# Patient Record
Sex: Female | Born: 1950 | Race: White | Hispanic: No | Marital: Married | Smoking: Never smoker
Health system: Southern US, Community
[De-identification: ages and names within clinical notes are randomized; demographics above are authoritative.]

## PROBLEM LIST (undated history)

## (undated) DIAGNOSIS — E079 Disorder of thyroid, unspecified: Secondary | ICD-10-CM

## (undated) DIAGNOSIS — F419 Anxiety disorder, unspecified: Secondary | ICD-10-CM

## (undated) DIAGNOSIS — M5020 Other cervical disc displacement, unspecified cervical region: Secondary | ICD-10-CM

## (undated) DIAGNOSIS — I10 Essential (primary) hypertension: Secondary | ICD-10-CM

## (undated) DIAGNOSIS — E785 Hyperlipidemia, unspecified: Secondary | ICD-10-CM

## (undated) DIAGNOSIS — E039 Hypothyroidism, unspecified: Secondary | ICD-10-CM

## (undated) HISTORY — PX: ANTERIOR CERVICAL DISCECTOMY: SHX1160

## (undated) HISTORY — PX: TONSILLECTOMY: SUR1361

## (undated) HISTORY — DX: Disorder of thyroid, unspecified: E07.9

## (undated) HISTORY — DX: Essential (primary) hypertension: I10

## (undated) HISTORY — DX: Anxiety disorder, unspecified: F41.9

## (undated) HISTORY — DX: Hyperlipidemia, unspecified: E78.5

## (undated) HISTORY — PX: ABDOMINAL HYSTERECTOMY: SHX81

## (undated) HISTORY — DX: Other cervical disc displacement, unspecified cervical region: M50.20

---

## 2015-12-06 DIAGNOSIS — E039 Hypothyroidism, unspecified: Secondary | ICD-10-CM | POA: Insufficient documentation

## 2015-12-06 DIAGNOSIS — I10 Essential (primary) hypertension: Secondary | ICD-10-CM | POA: Insufficient documentation

## 2015-12-06 DIAGNOSIS — Z981 Arthrodesis status: Secondary | ICD-10-CM | POA: Insufficient documentation

## 2015-12-06 DIAGNOSIS — E782 Mixed hyperlipidemia: Secondary | ICD-10-CM | POA: Insufficient documentation

## 2016-04-22 DIAGNOSIS — Z23 Encounter for immunization: Secondary | ICD-10-CM | POA: Diagnosis not present

## 2016-06-17 DIAGNOSIS — I10 Essential (primary) hypertension: Secondary | ICD-10-CM | POA: Diagnosis not present

## 2016-06-17 DIAGNOSIS — E782 Mixed hyperlipidemia: Secondary | ICD-10-CM | POA: Diagnosis not present

## 2016-06-17 DIAGNOSIS — M7711 Lateral epicondylitis, right elbow: Secondary | ICD-10-CM | POA: Diagnosis not present

## 2017-05-01 DIAGNOSIS — Z0000 Encounter for general adult medical examination without abnormal findings: Secondary | ICD-10-CM | POA: Diagnosis not present

## 2017-05-01 DIAGNOSIS — I10 Essential (primary) hypertension: Secondary | ICD-10-CM | POA: Diagnosis not present

## 2017-05-01 DIAGNOSIS — Z130 Encounter for screening for diseases of the blood and blood-forming organs and certain disorders involving the immune mechanism: Secondary | ICD-10-CM | POA: Diagnosis not present

## 2017-05-01 DIAGNOSIS — E039 Hypothyroidism, unspecified: Secondary | ICD-10-CM | POA: Diagnosis not present

## 2017-05-01 DIAGNOSIS — E785 Hyperlipidemia, unspecified: Secondary | ICD-10-CM | POA: Diagnosis not present

## 2017-05-06 DIAGNOSIS — I10 Essential (primary) hypertension: Secondary | ICD-10-CM | POA: Diagnosis not present

## 2017-11-23 DIAGNOSIS — H2513 Age-related nuclear cataract, bilateral: Secondary | ICD-10-CM | POA: Diagnosis not present

## 2017-12-14 ENCOUNTER — Ambulatory Visit (INDEPENDENT_AMBULATORY_CARE_PROVIDER_SITE_OTHER): Payer: Medicare Other | Admitting: Family Medicine

## 2017-12-14 ENCOUNTER — Encounter: Payer: Self-pay | Admitting: Family Medicine

## 2017-12-14 ENCOUNTER — Other Ambulatory Visit: Payer: Self-pay

## 2017-12-14 ENCOUNTER — Other Ambulatory Visit: Payer: Self-pay | Admitting: Family Medicine

## 2017-12-14 VITALS — BP 142/86 | HR 74 | Temp 98.1°F | Ht 64.5 in | Wt 143.2 lb

## 2017-12-14 DIAGNOSIS — Z23 Encounter for immunization: Secondary | ICD-10-CM | POA: Diagnosis not present

## 2017-12-14 DIAGNOSIS — M65341 Trigger finger, right ring finger: Secondary | ICD-10-CM | POA: Diagnosis not present

## 2017-12-14 DIAGNOSIS — E782 Mixed hyperlipidemia: Secondary | ICD-10-CM

## 2017-12-14 DIAGNOSIS — Z1239 Encounter for other screening for malignant neoplasm of breast: Secondary | ICD-10-CM

## 2017-12-14 DIAGNOSIS — I10 Essential (primary) hypertension: Secondary | ICD-10-CM

## 2017-12-14 DIAGNOSIS — Z1231 Encounter for screening mammogram for malignant neoplasm of breast: Secondary | ICD-10-CM | POA: Diagnosis not present

## 2017-12-14 DIAGNOSIS — E039 Hypothyroidism, unspecified: Secondary | ICD-10-CM

## 2017-12-14 LAB — TSH: TSH: 1.08 u[IU]/mL (ref 0.35–4.50)

## 2017-12-14 LAB — BASIC METABOLIC PANEL
BUN: 11 mg/dL (ref 6–23)
CHLORIDE: 100 meq/L (ref 96–112)
CO2: 27 mEq/L (ref 19–32)
Calcium: 9.9 mg/dL (ref 8.4–10.5)
Creatinine, Ser: 0.69 mg/dL (ref 0.40–1.20)
GFR: 90.27 mL/min (ref >60.00)
Glucose, Bld: 80 mg/dL (ref 70–99)
POTASSIUM: 3.8 meq/L (ref 3.5–5.1)
SODIUM: 136 meq/L (ref 135–145)

## 2017-12-14 MED ORDER — ZOSTER VAC RECOMB ADJUVANTED 50 MCG/0.5ML IM SUSR: 0.5000 mL | Freq: Once | INTRAMUSCULAR | 0 refills | Status: AC

## 2017-12-14 MED ORDER — LISINOPRIL 5 MG PO TABS: 5.0000 mg | ORAL_TABLET | Freq: Every day | ORAL | 3 refills | Status: DC

## 2017-12-14 NOTE — Progress Notes (Signed)
Subjective  CC:  Chief Complaint  Patient presents with  . Establish Care    Transfer from [facility], Last Physical 05/01/2017, Declines Dexa and Mammogram   . Immunizations    declines flu shot     HPI: Mercedes Briggs is a 67 y.o. female is a former [facility] patient and is here to reestablish care with me today. I last saw her in 2017. I have reviewed her records in detail  Last cpe 04/2017 with [facility]   She has the following concerns or needs:  Overall, she continues to do very well.  She has history of hypothyroidism and stopped her medications last year to see if she can control it naturally.  However her TSH was 17 in January.  She restarted her medications and feels well.  She does admit that she was feeling very cold all the time when she had stopped her medications.  She denies lower extremity edema, skin changes.  Her hair is still recovering.  No palpitations or sweats.  Hypertension: Was recommended to start lisinopril 10 mg in January but never did.  She keeps track of her blood pressures and has multiple logs averaging 150s over 60s to 70s on average.  She prefers not to use blood pressure medications.  Her diet is good, low in sodium, high in vegetables, and she remains very active.  She gets good rest.  She denies chest pain.  History of mixed hyperlipidemia but not on medications due to preferences.  Elevated cardiovascular risk.  See below.  She continues to decline medication.  Health maintenance: Has not had mammogram or bone density in many years.  Worries about radiation risk.  We discussed.  Due for Pneumovax.  Refuses flu shot.  Had Zostavax but due Shingrix.  I reviewed all her lab work.  New complaint: Trigger finger intermittently right hand and ring finger.  Tries to rest it.  No trauma.  Does lock  Assessment  1. Acquired hypothyroidism   2. Essential hypertension   3. Mixed hyperlipidemia   4. Trigger ring finger of right hand   5. Breast cancer screening       Plan   Hypothyroidism: Due for recheck on daily supplement.  Adjust dose if needed clinically euthyroid  Essential hypertension: Discussed diagnosis and treatment options.  Recommend lisinopril 5 mg daily.  Monitor for cough.  Check BMP today.  Recheck at follow-up visit to ensure stable electrolytes and renal function.  She should do well.  Patient willing to take medication daily.  Continue healthy lifestyle  Hyperlipidemia: Mildly elevated cardiovascular risk for adverse to statins.  Diet.  Finger: Recommend rest and splinting as needed.  Health maintenance: Pneumovax today.  Shingrix prescription printed for pharmacy.  Order mammogram.  Patient defers bone density at this time.  Recommended  Follow up:  Return in about 4 months (around 04/15/2018) for complete physical, follow up Hypertension, AWV.  Orders Placed This Encounter  Procedures  . MM DIGITAL SCREENING BILATERAL  . Pneumococcal polysaccharide vaccine 23-valent greater than or equal to 2yo subcutaneous/IM  . TSH  . Basic metabolic panel   Meds ordered this encounter  Medications  . lisinopril (PRINIVIL,ZESTRIL) 5 MG tablet    Sig: Take 1 tablet (5 mg total) by mouth daily.    Dispense:  90 tablet    Refill:  3  . Zoster Vaccine Adjuvanted [facility]) injection    Sig: Inject 0.5 mLs into the muscle once for 1 dose. Please give 2nd dose 2-6 months  after first dose    Dispense:  2 each    Refill:  0      We updated and reviewed the patient's past history in detail and it is documented below.  Patient Active Problem List   Diagnosis Date Noted  . Acquired hypothyroidism 12/06/2015  . Essential hypertension 12/06/2015  . History of fusion of cervical spine 12/06/2015  . Mixed hyperlipidemia 12/06/2015   Health Maintenance  Topic Date Due  . MAMMOGRAM  03/20/2001  . PNA vac Low Risk Adult (2 of 2 - PPSV23) 04/19/2017  . INFLUENZA VACCINE  12/14/2018 (Originally 11/05/2017)  . DEXA SCAN  12/14/2018 (Originally  03/20/2016)  . Hepatitis C Screening  12/15/2018 (Originally 02-23-1951)  . Fecal DNA (Cologuard)  01/02/2019   Immunization History  Administered Date(s) Administered  . Influenza, High Dose Seasonal PF 04/19/2016  . Pneumococcal Conjugate-13 04/19/2016  . Zoster 12/06/2015   Current Meds  Medication Sig  . Ascorbic Acid (VITAMIN C) 1000 MG tablet Take 1,000 mg by mouth daily.  Marland Kitchen b complex vitamins capsule Take 1 capsule by mouth daily.  . Coenzyme Q-10 100 MG capsule Take 100 mg by mouth daily.  Marland Kitchen levothyroxine (SYNTHROID, LEVOTHROID) 100 MCG tablet TAKE 1 TABLET BY MOUTH EVERY MORNING BEFORE BREAKFAST  . Magnesium 125 MG CAPS Take by mouth.  . Omega-3 Fatty Acids (OMEGA-3 2100 PO) Take by mouth.    Allergies: Patient is allergic to penicillins. Past Medical History Patient  has a past medical history of Herniated disc, cervical, Hyperlipidemia, Hypertension, and Thyroid disease. Past Surgical History Patient  has a past surgical history that includes Anterior cervical discectomy; Cesarean section; Tonsillectomy; and Abdominal hysterectomy. Family History: Patient family history includes Alzheimer's disease in her mother; Arthritis in her mother; Asthma in her father; Atrial fibrillation in her mother; COPD in her father; Dementia in her mother; Diabetes in her brother; Heart disease in her father; Hypertension in her father and mother; Neurodegenerative disease in her brother. Social History:  Patient  reports that she has quit smoking. She has never used smokeless tobacco. She reports that she drinks alcohol. She reports that she does not use drugs.  Review of Systems: Constitutional: negative for fever or malaise Ophthalmic: negative for photophobia, double vision or loss of vision Cardiovascular: negative for chest pain, dyspnea on exertion, or new LE swelling Respiratory: negative for SOB or persistent cough Gastrointestinal: negative for abdominal pain, change in bowel  habits or melena Genitourinary: negative for dysuria or gross hematuria Musculoskeletal: negative for new gait disturbance or muscular weakness Integumentary: negative for new or persistent rashes Neurological: negative for TIA or stroke symptoms Psychiatric: negative for SI or delusions Allergic/Immunologic: negative for hives  Patient Care Team    Relationship Specialty Notifications Start End  Willow Ora, MD PCP - General Family Medicine  12/14/17     Objective  Vitals: BP (!) 142/86   Pulse 74   Temp 98.1 F (36.7 C)   Ht 5' 4.5" (1.638 m)   Wt 143 lb 3.2 oz (65 kg)   SpO2 98%   BMI 24.20 kg/m  General:  Well developed, well nourished, no acute distress  Psych:  Alert and oriented,normal mood and affect HEENT:  Normocephalic, atraumatic, non-icteric sclera, PERRL, oropharynx is without mass or exudate, supple neck without adenopathy, mass or thyromegaly Cardiovascular:  RRR without gallop, rub or murmur, nondisplaced PMI Respiratory:  Good breath sounds bilaterally, CTAB with normal respiratory effort MSK: no deformities, contusions. Joints are without erythema or  swelling.  Skin:  Warm, no rashes or suspicious lesions noted Neurologic:    Mental status is normal. Gross motor and sensory exams are normal. Normal gait  Labs- see care everywhere: lipids. cmp and tsh.   Commons side effects, risks, benefits, and alternatives for medications and treatment plan prescribed today were discussed, and the patient expressed understanding of the given instructions. Patient is instructed to call or message via MyChart if he/she has any questions or concerns regarding our treatment plan. No barriers to understanding were identified. We discussed Red Flag symptoms and signs in detail. Patient expressed understanding regarding what to do in case of urgent or emergency type symptoms.   Medication list was reconciled, printed and provided to the patient in AVS. Patient instructions and  summary information was reviewed with the patient as documented in the AVS. This note was prepared with assistance of Dragon voice recognition software. Occasional wrong-word or sound-a-like substitutions may have occurred due to the inherent limitations of voice recognition software

## 2017-12-14 NOTE — Patient Instructions (Signed)
It was so good seeing you again! Thank you for establishing with my new practice and allowing me to continue caring for you. It means a lot to me.   Please schedule a follow up appointment with me in January 2020 for your annual complete physical; please come fasting.    Trigger Finger Trigger finger (stenosing tenosynovitis) is a condition that causes a finger to get stuck in a bent position. Each finger has a tough, cord-like tissue that connects muscle to bone (tendon), and each tendon is surrounded by a tunnel of tissue (tendon sheath). To move your finger, your tendon needs to slide freely through the sheath. Trigger finger happens when the tendon or the sheath thickens, making it difficult to move your finger. Trigger finger can affect any finger or a thumb. It may affect more than one finger. Mild cases may clear up with rest and medicine. Severe cases require more treatment. What are the causes? Trigger finger is caused by a thickened finger tendon or tendon sheath. The cause of this thickening is not known. What increases the risk? The following factors may make you more likely to develop this condition:  Doing activities that require a strong grip.  Having rheumatoid arthritis, gout, or diabetes.  Being 63-18 years old.  Being a woman.  What are the signs or symptoms? Symptoms of this condition include:  Pain when bending or straightening your finger.  Tenderness or swelling where your finger attaches to the palm of your hand.  A lump in the palm of your hand or on the inside of your finger.  Hearing a popping sound when you try to straighten your finger.  Feeling a popping, catching, or locking sensation when you try to straighten your finger.  Being unable to straighten your finger.  How is this diagnosed? This condition is diagnosed based on your symptoms and a physical exam. How is this treated? This condition may be treated by:  Resting your finger and  avoiding activities that make symptoms worse.  Wearing a finger splint to keep your finger in a slightly bent position.  Taking NSAIDs to relieve pain and swelling.  Injecting medicine (steroids) into the tendon sheath to reduce swelling and irritation. Injections may need to be repeated.  Having surgery to open the tendon sheath. This may be done if other treatments do not work and you cannot straighten your finger. You may need physical therapy after surgery.  Follow these instructions at home:  Use moist heat to help reduce pain and swelling as told by your health care provider.  Rest your finger and avoid activities that make pain worse. Return to normal activities as told by your health care provider.  If you have a splint, wear it as told by your health care provider.  Take over-the-counter and prescription medicines only as told by your health care provider.  Keep all follow-up visits as told by your health care provider. This is important. Contact a health care provider if:  Your symptoms are not improving with home care. Summary  Trigger finger (stenosing tenosynovitis) causes your finger to get stuck in a bent position, and it can make it difficult and painful to straighten your finger.  This condition develops when a finger tendon or tendon sheath thickens.  Treatment starts with resting, wearing a splint, and taking NSAIDs.  In severe cases, surgery to open the tendon sheath may be needed. This information is not intended to replace advice given to you by your health care  provider. Make sure you discuss any questions you have with your health care provider. Document Released: 01/12/2004 Document Revised: 03/04/2016 Document Reviewed: 03/04/2016 Elsevier Interactive Patient Education  2017 [employer]  Hypertension Hypertension, commonly called high blood pressure, is when the force of blood pumping through the arteries is too strong. The arteries are the blood  vessels that carry blood from the heart throughout the body. Hypertension forces the heart to work harder to pump blood and may cause arteries to become narrow or stiff. Having untreated or uncontrolled hypertension can cause heart attacks, strokes, kidney disease, and other problems. A blood pressure reading consists of a higher number over a lower number. Ideally, your blood pressure should be below 120/80. The first ("top") number is called the systolic pressure. It is a measure of the pressure in your arteries as your heart beats. The second ("bottom") number is called the diastolic pressure. It is a measure of the pressure in your arteries as the heart relaxes. What are the causes? The cause of this condition is not known. What increases the risk? Some risk factors for high blood pressure are under your control. Others are not. Factors you can change  Smoking.  Having type 2 diabetes mellitus, high cholesterol, or both.  Not getting enough exercise or physical activity.  Being overweight.  Having too much fat, sugar, calories, or salt (sodium) in your diet.  Drinking too much alcohol. Factors that are difficult or impossible to change  Having chronic kidney disease.  Having a family history of high blood pressure.  Age. Risk increases with age.  Race. You may be at higher risk if you are African-American.  Gender. Men are at higher risk than women before age 88. After age 29, women are at higher risk than men.  Having obstructive sleep apnea.  Stress. What are the signs or symptoms? Extremely high blood pressure (hypertensive crisis) may cause:  Headache.  Anxiety.  Shortness of breath.  Nosebleed.  Nausea and vomiting.  Severe chest pain.  Jerky movements you cannot control (seizures).  How is this diagnosed? This condition is diagnosed by measuring your blood pressure while you are seated, with your arm resting on a surface. The cuff of the blood pressure  monitor will be placed directly against the skin of your upper arm at the level of your heart. It should be measured at least twice using the same arm. Certain conditions can cause a difference in blood pressure between your right and left arms. Certain factors can cause blood pressure readings to be lower or higher than normal (elevated) for a short period of time:  When your blood pressure is higher when you are in a health care provider's office than when you are at home, this is called white coat hypertension. Most people with this condition do not need medicines.  When your blood pressure is higher at home than when you are in a health care provider's office, this is called masked hypertension. Most people with this condition may need medicines to control blood pressure.  If you have a high blood pressure reading during one visit or you have normal blood pressure with other risk factors:  You may be asked to return on a different day to have your blood pressure checked again.  You may be asked to monitor your blood pressure at home for 1 week or longer.  If you are diagnosed with hypertension, you may have other blood or imaging tests to help your health care provider  understand your overall risk for other conditions. How is this treated? This condition is treated by making healthy lifestyle changes, such as eating healthy foods, exercising more, and reducing your alcohol intake. Your health care provider may prescribe medicine if lifestyle changes are not enough to get your blood pressure under control, and if:  Your systolic blood pressure is above 130.  Your diastolic blood pressure is above 80.  Your personal target blood pressure may vary depending on your medical conditions, your age, and other factors. Follow these instructions at home: Eating and drinking  Eat a diet that is high in fiber and potassium, and low in sodium, added sugar, and fat. An example eating plan is called  the DASH (Dietary Approaches to Stop Hypertension) diet. To eat this way: ? Eat plenty of fresh fruits and vegetables. Try to fill half of your plate at each meal with fruits and vegetables. ? Eat whole grains, such as whole wheat pasta, brown rice, or whole grain bread. Fill about one quarter of your plate with whole grains. ? Eat or drink low-fat dairy products, such as skim milk or low-fat yogurt. ? Avoid fatty cuts of meat, processed or cured meats, and poultry with skin. Fill about one quarter of your plate with lean proteins, such as fish, chicken without skin, beans, eggs, and tofu. ? Avoid premade and processed foods. These tend to be higher in sodium, added sugar, and fat.  Reduce your daily sodium intake. Most people with hypertension should eat less than 1,500 mg of sodium a day.  Limit alcohol intake to no more than 1 drink a day for nonpregnant women and 2 drinks a day for men. One drink equals 12 oz of beer, 5 oz of wine, or 1 oz of hard liquor. Lifestyle  Work with your health care provider to maintain a healthy body weight or to lose weight. Ask what an ideal weight is for you.  Get at least 30 minutes of exercise that causes your heart to beat faster (aerobic exercise) most days of the week. Activities may include walking, swimming, or biking.  Include exercise to strengthen your muscles (resistance exercise), such as pilates or lifting weights, as part of your weekly exercise routine. Try to do these types of exercises for 30 minutes at least 3 days a week.  Do not use any products that contain nicotine or tobacco, such as cigarettes and e-cigarettes. If you need help quitting, ask your health care provider.  Monitor your blood pressure at home as told by your health care provider.  Keep all follow-up visits as told by your health care provider. This is important. Medicines  Take over-the-counter and prescription medicines only as told by your health care provider. Follow  directions carefully. Blood pressure medicines must be taken as prescribed.  Do not skip doses of blood pressure medicine. Doing this puts you at risk for problems and can make the medicine less effective.  Ask your health care provider about side effects or reactions to medicines that you should watch for. Contact a health care provider if:  You think you are having a reaction to a medicine you are taking.  You have headaches that keep coming back (recurring).  You feel dizzy.  You have swelling in your ankles.  You have trouble with your vision. Get help right away if:  You develop a severe headache or confusion.  You have unusual weakness or numbness.  You feel faint.  You have severe pain in your  chest or abdomen.  You vomit repeatedly.  You have trouble breathing. Summary  Hypertension is when the force of blood pumping through your arteries is too strong. If this condition is not controlled, it may put you at risk for serious complications.  Your personal target blood pressure may vary depending on your medical conditions, your age, and other factors. For most people, a normal blood pressure is less than 120/80.  Hypertension is treated with lifestyle changes, medicines, or a combination of both. Lifestyle changes include weight loss, eating a healthy, low-sodium diet, exercising more, and limiting alcohol. This information is not intended to replace advice given to you by your health care provider. Make sure you discuss any questions you have with your health care provider. Document Released: 03/24/2005 Document Revised: 02/20/2016 Document Reviewed: 02/20/2016 Elsevier Interactive Patient Education  [employer].

## 2017-12-15 ENCOUNTER — Other Ambulatory Visit: Payer: Self-pay | Admitting: Emergency Medicine

## 2017-12-15 MED ORDER — LEVOTHYROXINE SODIUM 100 MCG PO TABS: ORAL_TABLET | ORAL | 3 refills | Status: DC

## 2017-12-15 NOTE — Progress Notes (Signed)
Please call patient: I have reviewed his/her lab results. Thyroid is now perfect. Continue taking thyroid medication daily. Kidney function is normal as well. Ok to start low dose lisinopril for blood pressure. Thanks!

## 2017-12-24 ENCOUNTER — Telehealth: Payer: Self-pay | Admitting: Family Medicine

## 2017-12-24 MED ORDER — LEVOTHYROXINE SODIUM 100 MCG PO TABS: ORAL_TABLET | ORAL | 3 refills | Status: DC

## 2017-12-24 NOTE — Telephone Encounter (Signed)
Prescription sent to [facility], this morning.   Kathi SimpersAmy Peterman,  LPN

## 2017-12-24 NOTE — Telephone Encounter (Signed)
Copied from CRM [id number]. Topic: Quick Communication - See Telephone Encounter >> Dec 24, 2017  9:08 AM Arlyss Gandyichardson, Keigan Tafoya N, NT wrote: CRM for notification. See Telephone encounter for: 12/24/17. Pt states that [facility] did not receive the refill request for levothyroxine (SYNTHROID, LEVOTHROID) 100 MCG tablet and they stated to the pt that it will need to be faxed. Also, [place]. I did make this change under her preferred pharmacies. Please fax to [phone]. Rx#: [id number]

## 2018-01-14 ENCOUNTER — Ambulatory Visit
Admission: RE | Admit: 2018-01-14 | Discharge: 2018-01-14 | Disposition: A | Discharge status: Home / Self Care | Payer: Medicare Other | Source: Ambulatory Visit | Attending: Family Medicine | Admitting: Family Medicine

## 2018-01-14 DIAGNOSIS — Z1231 Encounter for screening mammogram for malignant neoplasm of breast: Secondary | ICD-10-CM | POA: Diagnosis not present

## 2018-12-13 DIAGNOSIS — H2513 Age-related nuclear cataract, bilateral: Secondary | ICD-10-CM | POA: Diagnosis not present

## 2018-12-20 ENCOUNTER — Encounter: Payer: Self-pay | Admitting: Family Medicine

## 2018-12-20 ENCOUNTER — Ambulatory Visit (INDEPENDENT_AMBULATORY_CARE_PROVIDER_SITE_OTHER): Payer: Medicare Other | Admitting: Family Medicine

## 2018-12-20 VITALS — BP 127/65 | Wt 137.8 lb

## 2018-12-20 DIAGNOSIS — E782 Mixed hyperlipidemia: Secondary | ICD-10-CM

## 2018-12-20 DIAGNOSIS — E039 Hypothyroidism, unspecified: Secondary | ICD-10-CM

## 2018-12-20 DIAGNOSIS — I10 Essential (primary) hypertension: Secondary | ICD-10-CM | POA: Diagnosis not present

## 2018-12-20 MED ORDER — LEVOTHYROXINE SODIUM 100 MCG PO TABS: ORAL_TABLET | ORAL | 3 refills | Status: DC

## 2018-12-20 MED ORDER — LISINOPRIL 5 MG PO TABS: 5.0000 mg | ORAL_TABLET | Freq: Every day | ORAL | 3 refills | Status: DC

## 2018-12-20 NOTE — Progress Notes (Signed)
Virtual Visit via Video Note  Subjective  CC:  Chief Complaint  Patient presents with  . Hypothyroidism  . Hypertension    Checks at home twice weekly, average 132/64.Marland Kitchen Reports highest has 141/57.  Marland Kitchen Hyperlipidemia     I connected with Mercedes Briggs on 12/20/18 at 10:40 AM EDT by a video enabled telemedicine application and verified that I am speaking with the correct person using two identifiers. Location patient: Home Location provider: [facility] participating in the virtual visit: Mercedes Briggs, Mercedes Arnt, MD Mercedes Briggs, Mercedes Briggs discussed the limitations of evaluation and management by telemedicine and the availability of in person appointments. The patient expressed understanding and agreed to proceed. HPI: Mercedes Briggs is a 68 y.o. female who was contacted today to address the problems listed above in the chief complaint/HTN f/u:  . Hypertension f/u: Control is good . Pt reports she is doing well. taking medications as instructed, no medication side effects noted, no TIAs, no chest pain on exertion, no dyspnea on exertion, no swelling of ankles. Due for visit and blood work. On low dose lisinopril and says she feels better. She denies adverse effects from his BP medications. Compliance with medication is good. Walking daily for exercise.  . Low thyroid on meds w/o sxs of low or high thyroid.  Needs refill.  She is overdue for blood work. . History of borderline hyperlipidemia: Due for recheck.  Eats healthy diet.  At ideal body weight. . Health maintenance: Due for complete physical.  Due for bone density and mammogram etc.  Discussed flu shot today.  BP Readings from Last 3 Encounters:  12/20/18 127/65  12/14/17 (!) 142/86   Wt Readings from Last 3 Encounters:  12/20/18 137 lb 12.8 oz (62.5 kg)  12/14/17 143 lb 3.2 oz (65 kg)    No results found for: CHOL No results found for: HDL No results  found for: LDLCALC No results found for: TRIG No results found for: CHOLHDL No results found for: LDLDIRECT Lab Results  Component Value Date   CREATININE 0.69 12/14/2017   BUN 11 12/14/2017   NA 136 12/14/2017   K 3.8 12/14/2017   CL 100 12/14/2017   CO2 27 12/14/2017    The ASCVD Risk score (Goff DC Jr., et al., 2013) failed to calculate for the following reasons:   Cannot find a previous HDL lab  Assessment  1. Essential hypertension   2. Acquired hypothyroidism   3. Mixed hyperlipidemia      Plan   Hypertension f/u: Good control by home readings.  Continue lisinopril.  Recommend visit to recheck blood work patient to schedule  Hypothyroidism: Well-controlled clinically.  Recommend visit to check blood work.  Refill medications.  History of hyperlipidemia and due for complete physical.  I discussed the assessment and treatment plan with the patient. The patient was provided an opportunity to ask questions and all were answered. The patient agreed with the plan and demonstrated an understanding of the instructions.   The patient was advised to call back or seek an in-person evaluation if the symptoms worsen or if the condition fails to improve as anticipated. Follow up: Complete physical with lab work.  Come fasting Visit date not found  Meds ordered this encounter  Medications  . levothyroxine (SYNTHROID) 100 MCG tablet    Sig: TAKE 1 TABLET BY MOUTH EVERY MORNING BEFORE BREAKFAST    Dispense:  90 tablet  Refill:  3  . lisinopril (ZESTRIL) 5 MG tablet    Sig: Take 1 tablet (5 mg total) by mouth daily.    Dispense:  90 tablet    Refill:  3      I reviewed the patients updated PMH, FH, and SocHx.    Patient Active Problem List   Diagnosis Date Noted  . Acquired hypothyroidism 12/06/2015  . Essential hypertension 12/06/2015  . History of fusion of cervical spine 12/06/2015  . Mixed hyperlipidemia 12/06/2015   Current Meds  Medication Sig  . Ascorbic Acid  (VITAMIN C) 1000 MG tablet Take 1,000 mg by mouth daily.  Marland Kitchen. b complex vitamins capsule Take 1 capsule by mouth daily.  . Coenzyme Q-10 100 MG capsule Take 100 mg by mouth daily.  Marland Kitchen. levothyroxine (SYNTHROID) 100 MCG tablet TAKE 1 TABLET BY MOUTH EVERY MORNING BEFORE BREAKFAST  . lisinopril (ZESTRIL) 5 MG tablet Take 1 tablet (5 mg total) by mouth daily.  . Magnesium 125 MG CAPS Take by mouth.  . Omega-3 Fatty Acids (OMEGA-3 2100 PO) Take by mouth.  . [DISCONTINUED] levothyroxine (SYNTHROID, LEVOTHROID) 100 MCG tablet TAKE 1 TABLET BY MOUTH EVERY MORNING BEFORE BREAKFAST  . [DISCONTINUED] lisinopril (PRINIVIL,ZESTRIL) 5 MG tablet Take 1 tablet (5 mg total) by mouth daily.    Allergies: Patient is allergic to penicillins. Family History: Patient family history includes Alzheimer's disease in her mother; Arthritis in her mother; Asthma in her father; Atrial fibrillation in her mother; COPD in her father; Dementia in her mother; Diabetes in her brother; Heart disease in her father; Hypertension in her father and mother; Neurodegenerative disease in her brother. Social History:  Patient  reports that she has quit smoking. She has never used smokeless tobacco. She reports current alcohol use. She reports that she does not use drugs.  Review of Systems: Constitutional: Negative for fever malaise or anorexia Cardiovascular: negative for chest pain Respiratory: negative for SOB or persistent cough Gastrointestinal: negative for abdominal pain  OBJECTIVE Vitals: BP 127/65   Wt 137 lb 12.8 oz (62.5 kg)   BMI 23.29 kg/m  General: no acute distress , A&Ox3  Mercedes Oraamille L Jaleil Renwick, MD

## 2019-01-10 DIAGNOSIS — Z23 Encounter for immunization: Secondary | ICD-10-CM | POA: Diagnosis not present

## 2019-01-13 ENCOUNTER — Ambulatory Visit: Payer: Medicare Other

## 2019-04-06 ENCOUNTER — Encounter: Payer: Medicare Other | Admitting: Family Medicine

## 2019-04-14 ENCOUNTER — Ambulatory Visit: Payer: Medicare Other | Attending: Internal Medicine

## 2019-04-14 DIAGNOSIS — Z20822 Contact with and (suspected) exposure to covid-19: Secondary | ICD-10-CM

## 2019-04-16 LAB — NOVEL CORONAVIRUS, NAA: SARS-CoV-2, NAA: NOT DETECTED

## 2019-05-20 DIAGNOSIS — Z23 Encounter for immunization: Secondary | ICD-10-CM | POA: Diagnosis not present

## 2019-06-17 DIAGNOSIS — Z23 Encounter for immunization: Secondary | ICD-10-CM | POA: Diagnosis not present

## 2019-07-21 ENCOUNTER — Other Ambulatory Visit: Payer: Self-pay

## 2019-07-21 ENCOUNTER — Ambulatory Visit (INDEPENDENT_AMBULATORY_CARE_PROVIDER_SITE_OTHER): Payer: Medicare Other

## 2019-07-21 ENCOUNTER — Telehealth: Payer: Self-pay

## 2019-07-21 DIAGNOSIS — Z0000 Encounter for general adult medical examination without abnormal findings: Secondary | ICD-10-CM

## 2019-07-21 DIAGNOSIS — Z1211 Encounter for screening for malignant neoplasm of colon: Secondary | ICD-10-CM

## 2019-07-21 NOTE — Progress Notes (Signed)
This visit is being conducted via phone call due to the COVID-19 pandemic. This patient has given me verbal consent via phone to conduct this visit, patient states they are participating from their home address. Some vital signs may be absent or patient reported.   Patient identification: identified by name, DOB, and current address.  Location provider: [facility], Office Persons participating in the virtual visit: Kandis Fantasia LPN, patient, and Dr. Asencion Partridge     Subjective:   Mercedes Briggs is a 69 y.o. female who presents for Medicare Annual (Subsequent) preventive examination.  Review of Systems:   Cardiac Risk Factors include: advanced age (>51men, >37 women);hypertension    Objective:     Vitals: There were no vitals taken for this visit.  There is no height or weight on file to calculate BMI.  Advanced Directives 07/21/2019  Does Patient Have a Medical Advance Directive? Yes  Type of Advance Directive Living will;Healthcare Power of Attorney  Does patient want to make changes to medical advance directive? No - Patient declined  Copy of Healthcare Power of Attorney in Chart? No - copy requested    Tobacco Social History   Tobacco Use  Smoking Status Former Smoker  Smokeless Tobacco Never Used  Tobacco Comment   occasionally      Counseling given: Not Answered Comment: occasionally    Clinical Intake:  Pre-visit preparation completed: Yes  Pain : No/denies pain  Diabetes: No  How often do you need to have someone help you when you read instructions, pamphlets, or other written materials from your doctor or pharmacy?: 1 - Never  Interpreter Needed?: No  Information entered by :: Kandis Fantasia LPN  Past Medical History:  Diagnosis Date  . Herniated disc, cervical   . Hyperlipidemia   . Hypertension   . Thyroid disease    Past Surgical History:  Procedure Laterality Date  . ABDOMINAL HYSTERECTOMY    . ANTERIOR CERVICAL DISCECTOMY    .  CESAREAN SECTION    . TONSILLECTOMY     Family History  Problem Relation Age of Onset  . Diabetes Brother   . Neurodegenerative disease Brother        storage cell disease  . Alzheimer's disease Mother   . Arthritis Mother   . Atrial fibrillation Mother   . Dementia Mother   . Hypertension Mother   . Asthma Father   . COPD Father   . Heart disease Father   . Hypertension Father    Social History   Socioeconomic History  . Marital status: Married    Spouse name: Not on file  . Number of children: Not on file  . Years of education: Not on file  . Highest education level: Not on file  Occupational History  . Occupation: Retired     Comment: Doctor, general practice   Tobacco Use  . Smoking status: Former Games developer  . Smokeless tobacco: Never Used  . Tobacco comment: occasionally   Substance and Sexual Activity  . Alcohol use: Yes    Comment: socially  . Drug use: Never  . Sexual activity: Yes    Birth control/protection: Post-menopausal  Other Topics Concern  . Not on file  Social History Narrative   1 daughter that lives in [place]    Social Determinants of Health   Financial Resource Strain:   . Difficulty of Paying Living Expenses:   Food Insecurity:   . Worried About Programme researcher, broadcasting/film/video in the Last Year:   . Ran  Out of Food in the Last Year:   Transportation Needs:   . Lack of Transportation (Medical):   Marland Kitchen Lack of Transportation (Non-Medical):   Physical Activity:   . Days of Exercise per Week:   . Minutes of Exercise per Session:   Stress:   . Feeling of Stress :   Social Connections:   . Frequency of Communication with Friends and Family:   . Frequency of Social Gatherings with Friends and Family:   . Attends Religious Services:   . Active Member of Clubs or Organizations:   . Attends Banker Meetings:   Marland Kitchen Marital Status:     Outpatient Encounter Medications as of 07/21/2019  Medication Sig  . Ascorbic Acid (VITAMIN C) 1000 MG tablet Take  1,000 mg by mouth daily.  Marland Kitchen b complex vitamins capsule Take 1 capsule by mouth daily.  . Coenzyme Q-10 100 MG capsule Take 100 mg by mouth daily.  Marland Kitchen levothyroxine (SYNTHROID) 100 MCG tablet TAKE 1 TABLET BY MOUTH EVERY MORNING BEFORE BREAKFAST  . lisinopril (ZESTRIL) 5 MG tablet Take 1 tablet (5 mg total) by mouth daily.  . Magnesium 125 MG CAPS Take by mouth.  . Omega-3 Fatty Acids (OMEGA-3 2100 PO) Take by mouth.   No facility-administered encounter medications on file as of 07/21/2019.    Activities of Daily Living In your present state of health, do you have any difficulty performing the following activities: 07/21/2019  Hearing? N  Vision? N  Difficulty concentrating or making decisions? N  Walking or climbing stairs? N  Dressing or bathing? N  Doing errands, shopping? N  Preparing Food and eating ? N  Using the Toilet? N  In the past six months, have you accidently leaked urine? N  Do you have problems with loss of bowel control? N  Managing your Medications? N  Managing your Finances? N  Housekeeping or managing your Housekeeping? N  Some recent data might be hidden    Patient Care Team: Willow Ora, MD as PCP - General (Family Medicine) Annette Stable, OD as Consulting Physician (Optometry)    Assessment:   This is a routine wellness examination for Mercedes Briggs.  Exercise Activities and Dietary recommendations Current Exercise Habits: Home exercise routine, Type of exercise: walking, Time (Minutes): 45, Frequency (Times/Week): 5, Weekly Exercise (Minutes/Week): 225  Goals   None     Fall Risk Fall Risk  07/21/2019 12/20/2018 12/14/2017  Falls in the past year? 0 0 No  Number falls in past yr: 0 0 -  Injury with Fall? 0 0 -  Follow up Falls evaluation completed;Education provided;Falls prevention discussed Falls evaluation completed -   Is the patient's home free of loose throw rugs in walkways, pet beds, electrical cords, etc?   yes      Grab bars in the  bathroom? yes      Handrails on the stairs?   yes      Adequate lighting?   yes  Depression Screen PHQ 2/9 Scores 07/21/2019 12/20/2018 12/14/2017  PHQ - 2 Score 0 0 0     Cognitive Function: no cognitive concerns at this time    6CIT Screen 07/21/2019  What Year? 0 points  What month? 0 points  What time? 0 points  Count back from 20 0 points  Months in reverse 0 points  Repeat phrase 0 points  Total Score 0    Immunization History  Administered Date(s) Administered  . Influenza, High Dose Seasonal PF 04/19/2016  .  Pneumococcal Conjugate-13 04/19/2016  . Pneumococcal Polysaccharide-23 12/14/2017  . Zoster 12/06/2015    Qualifies for Shingles Vaccine?Discussed and patient will check with pharmacy for coverage.  Patient education handout provided   Screening Tests Health Maintenance  Topic Date Due  . Hepatitis C Screening  Never done  . DEXA SCAN  Never done  . Fecal DNA (Cologuard)  01/02/2019  . MAMMOGRAM  01/15/2019  . INFLUENZA VACCINE  11/06/2019  . PNA vac Low Risk Adult  Completed    Cancer Screenings: Lung: Low Dose CT Chest recommended if Age 52-80 years, 30 pack-year currently smoking OR have quit w/in 15years. Patient does not qualify. Breast:  Up to date on Mammogram? Yes   Up to date of Bone Density/Dexa? No; declines at this time  Colorectal: Cologuard ordered today    Plan:  I have personally reviewed and addressed the Medicare Annual Wellness questionnaire and have noted the following in the patient's chart:  A. Medical and social history B. Use of alcohol, tobacco or illicit drugs  C. Current medications and supplements D. Functional ability and status E.  Nutritional status F.  Physical activity G. Advance directives H. List of other physicians I.  Hospitalizations, surgeries, and ER visits in previous 12 months J.  [place] such as hearing and vision if needed, cognitive and depression L. Referrals, records requested, and  appointments- Cologuard ordered   In addition, I have reviewed and discussed with patient certain preventive protocols, quality metrics, and best practice recommendations. A written personalized care plan for preventive services as well as general preventive health recommendations were provided to patient.   Signed,  Denman George, LPN  Nurse Health Advisor   Nurse Notes: Patient would like to have labs before 09/26/19 visit to be able to discuss results during office visit.  See telephone note.  FYI patient has completed Covid vaccines- Moderna

## 2019-07-21 NOTE — Patient Instructions (Signed)
Ms. Due , Thank you for taking time to come for your Medicare Wellness Visit. I appreciate your ongoing commitment to your health goals. Please review the following plan we discussed and let me know if I can assist you in the future.   Screening recommendations/referrals: Colorectal Screening: Cologuard ordered today Mammogram: up to date; last 01/14/18 (recommended repeat 01/2020) Bone Density: recommended   Vision and Dental Exams: Recommended annual ophthalmology exams for early detection of glaucoma and other disorders of the eye Recommended annual dental exams for proper oral hygiene  Vaccinations: Influenza vaccine: completed 01/10/19 Pneumococcal vaccine: up to date; last 12/14/17 Tdap vaccine: recommended every 10 years; Please call your insurance company to determine your out of pocket expense. You also receive this vaccine at your local pharmacy or Health Dept. Shingles vaccine:  You may receive this vaccine at your local pharmacy. (see handout)  Covid vaccine:  Completed   Advanced directives: Please bring a copy of your POA (Power of Attorney) and/or Living Will to your next appointment.  Goals: Recommend to drink at least 6-8 8oz glasses of water per day and consume a balanced diet rich in fresh fruits and vegetables.   Next appointment: Please schedule your Annual Wellness Visit with your Nurse Health Advisor in one year.  Preventive Care 51 Years and Older, Female Preventive care refers to lifestyle choices and visits with your health care provider that can promote health and wellness. What does preventive care include?  A yearly physical exam. This is also called an annual well check.  Dental exams once or twice a year.  Routine eye exams. Ask your health care provider how often you should have your eyes checked.  Personal lifestyle choices, including:  Daily care of your teeth and gums.  Regular physical activity.  Eating a healthy diet.   Avoiding tobacco and drug use.  Limiting alcohol use.  Practicing safe sex.  Taking low-dose aspirin every day if recommended by your health care provider.  Taking vitamin and mineral supplements as recommended by your health care provider. What happens during an annual well check? The services and screenings done by your health care provider during your annual well check will depend on your age, overall health, lifestyle risk factors, and family history of disease. Counseling  Your health care provider may ask you questions about your:  Alcohol use.  Tobacco use.  Drug use.  Emotional well-being.  Home and relationship well-being.  Sexual activity.  Eating habits.  History of falls.  Memory and ability to understand (cognition).  Work and work Statistician.  Reproductive health. Screening  You may have the following tests or measurements:  Height, weight, and BMI.  Blood pressure.  Lipid and cholesterol levels. These may be checked every 5 years, or more frequently if you are over 42 years old.  Skin check.  Lung cancer screening. You may have this screening every year starting at age 33 if you have a 30-pack-year history of smoking and currently smoke or have quit within the past 15 years.  Fecal occult blood test (FOBT) of the stool. You may have this test every year starting at age 90.  Flexible sigmoidoscopy or colonoscopy. You may have a sigmoidoscopy every 5 years or a colonoscopy every 10 years starting at age 73.  Hepatitis C blood test.  Hepatitis B blood test.  Sexually transmitted disease (STD) testing.  Diabetes screening. This is done by checking your blood sugar (glucose) after you have not eaten for a while (fasting).  You may have this done every 1-3 years.  Bone density scan. This is done to screen for osteoporosis. You may have this done starting at age 21.  Mammogram. This may be done every 1-2 years. Talk to your health care provider  about how often you should have regular mammograms. Talk with your health care provider about your test results, treatment options, and if necessary, the need for more tests. Vaccines  Your health care provider may recommend certain vaccines, such as:  Influenza vaccine. This is recommended every year.  Tetanus, diphtheria, and acellular pertussis (Tdap, Td) vaccine. You may need a Td booster every 10 years.  Zoster vaccine. You may need this after age 35.  Pneumococcal 13-valent conjugate (PCV13) vaccine. One dose is recommended after age 43.  Pneumococcal polysaccharide (PPSV23) vaccine. One dose is recommended after age 33. Talk to your health care provider about which screenings and vaccines you need and how often you need them. This information is not intended to replace advice given to you by your health care provider. Make sure you discuss any questions you have with your health care provider. Document Released: 04/20/2015 Document Revised: 12/12/2015 Document Reviewed: 01/23/2015 Elsevier Interactive Patient Education  2017 [employer].  Fall Prevention in the Home Falls can cause injuries. They can happen to people of all ages. There are many things you can do to make your home safe and to help prevent falls. What can I do on the outside of my home?  Regularly fix the edges of walkways and driveways and fix any cracks.  Remove anything that might make you trip as you walk through a door, such as a raised step or threshold.  Trim any bushes or trees on the path to your home.  Use bright outdoor lighting.  Clear any walking paths of anything that might make someone trip, such as rocks or tools.  Regularly check to see if handrails are loose or broken. Make sure that both sides of any steps have handrails.  Any raised decks and porches should have guardrails on the edges.  Have any leaves, snow, or ice cleared regularly.  Use sand or salt on walking paths during winter.   Clean up any spills in your garage right away. This includes oil or grease spills. What can I do in the bathroom?  Use night lights.  Install grab bars by the toilet and in the tub and shower. Do not use towel bars as grab bars.  Use non-skid mats or decals in the tub or shower.  If you need to sit down in the shower, use a plastic, non-slip stool.  Keep the floor dry. Clean up any water that spills on the floor as soon as it happens.  Remove soap buildup in the tub or shower regularly.  Attach bath mats securely with double-sided non-slip rug tape.  Do not have throw rugs and other things on the floor that can make you trip. What can I do in the bedroom?  Use night lights.  Make sure that you have a light by your bed that is easy to reach.  Do not use any sheets or blankets that are too big for your bed. They should not hang down onto the floor.  Have a firm chair that has side arms. You can use this for support while you get dressed.  Do not have throw rugs and other things on the floor that can make you trip. What can I do in the kitchen?  Clean up any spills right away.  Avoid walking on wet floors.  Keep items that you use a lot in easy-to-reach places.  If you need to reach something above you, use a strong step stool that has a grab bar.  Keep electrical cords out of the way.  Do not use floor polish or wax that makes floors slippery. If you must use wax, use non-skid floor wax.  Do not have throw rugs and other things on the floor that can make you trip. What can I do with my stairs?  Do not leave any items on the stairs.  Make sure that there are handrails on both sides of the stairs and use them. Fix handrails that are broken or loose. Make sure that handrails are as long as the stairways.  Check any carpeting to make sure that it is firmly attached to the stairs. Fix any carpet that is loose or worn.  Avoid having throw rugs at the top or bottom of  the stairs. If you do have throw rugs, attach them to the floor with carpet tape.  Make sure that you have a light switch at the top of the stairs and the bottom of the stairs. If you do not have them, ask someone to add them for you. What else can I do to help prevent falls?  Wear shoes that:  Do not have high heels.  Have rubber bottoms.  Are comfortable and fit you well.  Are closed at the toe. Do not wear sandals.  If you use a stepladder:  Make sure that it is fully opened. Do not climb a closed stepladder.  Make sure that both sides of the stepladder are locked into place.  Ask someone to hold it for you, if possible.  Clearly mark and make sure that you can see:  Any grab bars or handrails.  First and last steps.  Where the edge of each step is.  Use tools that help you move around (mobility aids) if they are needed. These include:  Canes.  Walkers.  Scooters.  Crutches.  Turn on the lights when you go into a dark area. Replace any light bulbs as soon as they burn out.  Set up your furniture so you have a clear path. Avoid moving your furniture around.  If any of your floors are uneven, fix them.  If there are any pets around you, be aware of where they are.  Review your medicines with your doctor. Some medicines can make you feel dizzy. This can increase your chance of falling. Ask your doctor what other things that you can do to help prevent falls. This information is not intended to replace advice given to you by your health care provider. Make sure you discuss any questions you have with your health care provider. Document Released: 01/18/2009 Document Revised: 08/30/2015 Document Reviewed: 04/28/2014 Elsevier Interactive Patient Education  2017 [employer].

## 2019-07-21 NOTE — Telephone Encounter (Signed)
Patient is asking if she can have labs done prior to upcoming appointment.  She is concerned about possible anemia with being cold all the time. Please advise

## 2019-07-21 NOTE — Telephone Encounter (Signed)
I will get necessary labs at her appt.  I believe she has canceled her f/u twice with me.  Scheduled for June now.  thanks

## 2019-07-22 NOTE — Telephone Encounter (Signed)
Message sent to patient notifying of decision

## 2019-07-25 ENCOUNTER — Encounter: Payer: Medicare Other | Admitting: Family Medicine

## 2019-08-08 DIAGNOSIS — Z1211 Encounter for screening for malignant neoplasm of colon: Secondary | ICD-10-CM | POA: Diagnosis not present

## 2019-08-12 LAB — COLOGUARD
COLOGUARD: NEGATIVE
Cologuard: NEGATIVE

## 2019-09-26 ENCOUNTER — Encounter: Payer: Self-pay | Admitting: Family Medicine

## 2019-09-26 ENCOUNTER — Other Ambulatory Visit: Payer: Self-pay

## 2019-09-26 ENCOUNTER — Ambulatory Visit (INDEPENDENT_AMBULATORY_CARE_PROVIDER_SITE_OTHER): Payer: Medicare Other | Admitting: Family Medicine

## 2019-09-26 VITALS — BP 132/72 | HR 78 | Temp 98.3°F | Resp 14 | Ht 63.25 in | Wt 136.6 lb

## 2019-09-26 DIAGNOSIS — Z1231 Encounter for screening mammogram for malignant neoplasm of breast: Secondary | ICD-10-CM

## 2019-09-26 DIAGNOSIS — E2839 Other primary ovarian failure: Secondary | ICD-10-CM

## 2019-09-26 DIAGNOSIS — I10 Essential (primary) hypertension: Secondary | ICD-10-CM

## 2019-09-26 DIAGNOSIS — Z981 Arthrodesis status: Secondary | ICD-10-CM | POA: Diagnosis not present

## 2019-09-26 DIAGNOSIS — Z1159 Encounter for screening for other viral diseases: Secondary | ICD-10-CM

## 2019-09-26 DIAGNOSIS — E039 Hypothyroidism, unspecified: Secondary | ICD-10-CM

## 2019-09-26 DIAGNOSIS — Z5320 Procedure and treatment not carried out because of patient's decision for unspecified reasons: Secondary | ICD-10-CM

## 2019-09-26 DIAGNOSIS — Z5329 Procedure and treatment not carried out because of patient's decision for other reasons: Secondary | ICD-10-CM

## 2019-09-26 DIAGNOSIS — E782 Mixed hyperlipidemia: Secondary | ICD-10-CM

## 2019-09-26 NOTE — Patient Instructions (Signed)
Please return in 12 months for your annual complete physical; please come fasting. And keep an eye on your blood pressure. We want it 120s-130/70s consistently.  Try otc Zadator or pataday for your itchy eyes. This is likely allergic.   I will release your lab results to you on your MyChart account with further instructions. Please reply with any questions.   I will look for your cologuard results.   I have ordered a mammogram and/or bone density for you as we discussed today: '[x]'   Mammogram  '[x]'   Bone Density  Please call the office checked below to schedule your appointment: Your appointment will at the following location  '[x]'   [facility]      [place], [place]         '[]'   [facility]  [place], [place]  If you have any questions or concerns, please don't hesitate to send me a message via MyChart or call the office at [phone]. Thank you for visiting with [place] today! It's our pleasure caring for you.   Calcium Intake Recommendations You can take Caltrate Plus twice a day or get it through your diet or other OTC supplements (Viactiv, OsCal etc)  Calcium is a mineral that affects many functions in the body, including:  Blood clotting.  Blood vessel function.  Nerve impulse conduction.  Hormone secretion.  Muscle contraction.  Bone and teeth functions.  Most of your body's calcium supply is stored in your bones and teeth. When your calcium stores are low, you may be at risk for low bone mass, bone loss, and bone fractures. Consuming enough calcium helps to grow healthy bones and teeth and to prevent breakdown over time. It is very important that you get enough calcium if you are:  A child undergoing rapid growth.  An adolescent girl.  A pre- or post-menopausal woman.  A woman whose menstrual cycle has stopped due to anorexia nervosa or regular intense  exercise.  An individual with lactose intolerance or a milk allergy.  A vegetarian.  What is my plan? Try to consume the recommended amount of calcium daily based on your age. Depending on your overall health, your health care provider may recommend increased calcium intake.General daily calcium intake recommendations by age are:  Birth to 6 months: 200 mg.  Infants 7 to 12 months: 260 mg.  Children 1 to 3 years: 700 mg.  Children 4 to 8 years: 1,000 mg.  Children 9 to 13 years: 1,300 mg.  Teens 14 to 18 years: 1,300 mg.  Adults 19 to 50 years: 1,000 mg.  Adult women 51 to 70 years: 1,200 mg.  Adult men 51 to 70 years: 1,000 mg.  Adults 71 years and older: 1,200 mg.  Pregnant and breastfeeding teens: 1,300 mg.  Pregnant and breastfeeding adults: 1,000 mg.  What do I need to know about calcium intake?  In order for the body to absorb calcium, it needs vitamin D. You can get vitamin D through (we recommend getting [phone] units of Vitamin D daily) ? Direct exposure of the skin to sunlight. ? Foods, such as egg yolks, liver, saltwater fish, and fortified milk. ? Supplements.  Consuming too much calcium may cause: ? Constipation. ? Decreased absorption of iron and zinc. ? Kidney stones.  Calcium supplements may interact with certain medicines. Check with your  health care provider before starting any calcium supplements.  Try to get most of your calcium from food. What foods can I eat? Grains  Fortified oatmeal. Fortified ready-to-eat cereals. Fortified frozen waffles. Vegetables Turnip greens. Broccoli. Fruits Fortified orange juice. Meats and Other Protein Sources Canned sardines with bones. Canned salmon with bones. Soy beans. Tofu. Baked beans. Almonds. [place] nuts. Sunflower seeds. Dairy Milk. Yogurt. Cheese. Cottage cheese. Beverages Fortified soy milk. Fortified rice milk. Sweets/Desserts Pudding. Ice Cream. Milkshakes. Blackstrap molasses. The  items listed above may not be a complete list of recommended foods or beverages. Contact your dietitian for more options. What foods can affect my calcium intake? It may be more difficult for your body to use calcium or calcium may leave your body more quickly if you consume large amounts of:  Sodium.  Protein.  Caffeine.  Alcohol.  This information is not intended to replace advice given to you by your health care provider. Make sure you discuss any questions you have with your health care provider. Document Released: 11/06/2003 Document Revised: 10/12/2015 Document Reviewed: 08/30/2013 Elsevier Interactive Patient Education  2018 [place] 65 Years and Older, Female Preventive care refers to lifestyle choices and visits with your health care provider that can promote health and wellness. This includes:  A yearly physical exam. This is also called an annual well check.  Regular dental and eye exams.  Immunizations.  Screening for certain conditions.  Healthy lifestyle choices, such as diet and exercise. What can I expect for my preventive care visit? Physical exam Your health care provider will check:  Height and weight. These may be used to calculate body mass index (BMI), which is a measurement that tells if you are at a healthy weight.  Heart rate and blood pressure.  Your skin for abnormal spots. Counseling Your health care provider may ask you questions about:  Alcohol, tobacco, and drug use.  Emotional well-being.  Home and relationship well-being.  Sexual activity.  Eating habits.  History of falls.  Memory and ability to understand (cognition).  Work and work Statistician.  Pregnancy and menstrual history. What immunizations do I need?  Influenza (flu) vaccine  This is recommended every year. Tetanus, diphtheria, and pertussis (Tdap) vaccine  You may need a Td booster every 10 years. Varicella (chickenpox) vaccine  You  may need this vaccine if you have not already been vaccinated. Zoster (shingles) vaccine  You may need this after age 33. Pneumococcal conjugate (PCV13) vaccine  One dose is recommended after age 3. Pneumococcal polysaccharide (PPSV23) vaccine  One dose is recommended after age 9. Measles, mumps, and rubella (MMR) vaccine  You may need at least one dose of MMR if you were born in 1957 or later. You may also need a second dose. Meningococcal conjugate (MenACWY) vaccine  You may need this if you have certain conditions. Hepatitis A vaccine  You may need this if you have certain conditions or if you travel or work in places where you may be exposed to hepatitis A. Hepatitis B vaccine  You may need this if you have certain conditions or if you travel or work in places where you may be exposed to hepatitis B. Haemophilus influenzae type b (Hib) vaccine  You may need this if you have certain conditions. You may receive vaccines as individual doses or as more than one vaccine together in one shot (combination vaccines). Talk with your health care provider about the risks and benefits of combination vaccines.  What tests do I need? Blood tests  Lipid and cholesterol levels. These may be checked every 5 years, or more frequently depending on your overall health.  Hepatitis C test.  Hepatitis B test. Screening  Lung cancer screening. You may have this screening every year starting at age 39 if you have a 30-pack-year history of smoking and currently smoke or have quit within the past 15 years.  Colorectal cancer screening. All adults should have this screening starting at age 25 and continuing until age 67. Your health care provider may recommend screening at age 18 if you are at increased risk. You will have tests every 1-10 years, depending on your results and the type of screening test.  Diabetes screening. This is done by checking your blood sugar (glucose) after you have not eaten  for a while (fasting). You may have this done every 1-3 years.  Mammogram. This may be done every 1-2 years. Talk with your health care provider about how often you should have regular mammograms.  BRCA-related cancer screening. This may be done if you have a family history of breast, ovarian, tubal, or peritoneal cancers. Other tests  Sexually transmitted disease (STD) testing.  Bone density scan. This is done to screen for osteoporosis. You may have this done starting at age 82. Follow these instructions at home: Eating and drinking  Eat a diet that includes fresh fruits and vegetables, whole grains, lean protein, and low-fat dairy products. Limit your intake of foods with high amounts of sugar, saturated fats, and salt.  Take vitamin and mineral supplements as recommended by your health care provider.  Do not drink alcohol if your health care provider tells you not to drink.  If you drink alcohol: ? Limit how much you have to 0-1 drink a day. ? Be aware of how much alcohol is in your drink. In the [place] one drink equals one 12 oz bottle of beer (355 mL), one 5 oz glass of wine (148 mL), or one 1 oz glass of hard liquor (44 mL). Lifestyle  Take daily care of your teeth and gums.  Stay active. Exercise for at least 30 minutes on 5 or more days each week.  Do not use any products that contain nicotine or tobacco, such as cigarettes, e-cigarettes, and chewing tobacco. If you need help quitting, ask your health care provider.  If you are sexually active, practice safe sex. Use a condom or other form of protection in order to prevent STIs (sexually transmitted infections).  Talk with your health care provider about taking a low-dose aspirin or statin. What's next?  Go to your health care provider once a year for a well check visit.  Ask your health care provider how often you should have your eyes and teeth checked.  Stay up to date on all vaccines. This information is not  intended to replace advice given to you by your health care provider. Make sure you discuss any questions you have with your health care provider. Document Revised: 03/18/2018 Document Reviewed: 03/18/2018 Elsevier Patient Education  2020 [employer].

## 2019-09-26 NOTE — Progress Notes (Signed)
Subjective  Chief Complaint  Patient presents with  . Annual Exam    not fasting   . Hypertension    has logs from bp readings     HPI: Mercedes Briggs is a 69 y.o. female who presents to [facility] today for a Female Wellness Visit. She also has the concerns and/or needs as listed above in the chief complaint. These will be addressed in addition to the Health Maintenance Visit.   Wellness Visit: annual visit with health maintenance review and exam without Pap   HM: reports sent in cologuard on May 10th. We haven't received the results. Due mammo and dexa. Now ready to get done. imms are up to date. Needs hep c screen.   Chronic disease f/u and/or acute problem visit: (deemed necessary to be done in addition to the wellness visit):  HTN: Feeling well. Taking medications w/o adverse effects. No symptoms of CHF, angina; no palpitations, sob, cp or lower extremity edema. Compliant with meds. Takes home readings q2-4 weeks. Last 3 months perfect control. On low dose lisinopril.   Hypothyroidism: has hair thinning and cold feet. No palpitations or weight changes. Compliant with meds. Last checked 2 years ago  HLD on statin: last ate about 7.5 hours ago. No AEs.   Assessment  1. Essential hypertension   2. Acquired hypothyroidism   3. History of fusion of cervical spine   4. Mixed hyperlipidemia   5. Hypoestrogenism   6. Screening mammogram, encounter for   7. Need for hepatitis C screening test   8. Refusal of statin medication by patient      Plan  Female Wellness Visit:  Age appropriate Health Maintenance and Prevention measures were discussed with patient. Included topics are cancer screening recommendations, ways to keep healthy (see AVS) including dietary and exercise recommendations, regular eye and dental care, use of seat belts, and avoidance of moderate alcohol use and tobacco use. mammo and dexa ordered. Discussed calcium intake. Will  f/u on cologuard results.   BMI: discussed patient's BMI and encouraged positive lifestyle modifications to help get to or maintain a target BMI.  HM needs and immunizations were addressed and ordered. See below for orders. See HM and immunization section for updates.  Routine labs and screening tests ordered including cmp, cbc and lipids where appropriate.  Discussed recommendations regarding Vit D and calcium supplementation (see AVS)  Chronic disease management visit and/or acute problem visit:  HTN is well controlled by home readings. Continue same meds and recheck labs.   HLD: due for recheck. Has declined cholesterol lowering medications in past  Low thyroid: recheck levels. Adjust if indicated  Follow up: 12 months for cpe. Sooner if BP elevates  Orders Placed This Encounter  Procedures  . MM DIGITAL SCREENING BILATERAL  . DG Bone Density  . CBC with Differential/Platelet  . Comprehensive metabolic panel  . Lipid panel  . Hepatitis C antibody  . TSH   No orders of the defined types were placed in this encounter.     Lifestyle: Body mass index is 24.01 kg/m. Wt Readings from Last 3 Encounters:  09/26/19 136 lb 9.6 oz (62 kg)  12/20/18 137 lb 12.8 oz (62.5 kg)  12/14/17 143 lb 3.2 oz (65 kg)     Patient Active Problem List   Diagnosis Date Noted  . Refusal of statin medication by patient 09/26/2019  . Acquired hypothyroidism 12/06/2015  . Essential hypertension 12/06/2015  . History of fusion of cervical  spine 12/06/2015  . Mixed hyperlipidemia 12/06/2015    Elevated ASCVD score and lipids but declines meds.     Health Maintenance  Topic Date Due  . Hepatitis C Screening  Never done  . Fecal DNA (Cologuard)  01/02/2019  . MAMMOGRAM  01/15/2019  . DEXA SCAN  07/20/2020 (Originally 03/20/2016)  . INFLUENZA VACCINE  11/06/2019  . COVID-19 Vaccine  Completed  . PNA vac Low Risk Adult  Completed   Immunization History  Administered Date(s) Administered    . Influenza, High Dose Seasonal PF 04/19/2016  . Moderna SARS-COVID-2 Vaccination 05/20/2019, 06/17/2019  . Pneumococcal Conjugate-13 04/19/2016  . Pneumococcal Polysaccharide-23 12/14/2017  . Zoster 12/06/2015   We updated and reviewed the patient's past history in detail and it is documented below. Allergies: Patient is allergic to penicillins. Past Medical History Patient  has a past medical history of Herniated disc, cervical, Hyperlipidemia, Hypertension, and Thyroid disease. Past Surgical History Patient  has a past surgical history that includes Anterior cervical discectomy; Cesarean section; Tonsillectomy; and Abdominal hysterectomy. Family History: Patient family history includes Alzheimer's disease in her mother; Arthritis in her mother; Asthma in her father; Atrial fibrillation in her mother; COPD in her father; Dementia in her mother; Diabetes in her brother; Heart disease in her father; Hypertension in her father and mother; Neurodegenerative disease in her brother. Social History:  Patient  reports that she has quit smoking. She has never used smokeless tobacco. She reports current alcohol use. She reports that she does not use drugs.  Review of Systems: Constitutional: negative for fever or malaise Ophthalmic: negative for photophobia, double vision or loss of vision Cardiovascular: negative for chest pain, dyspnea on exertion, or new LE swelling Respiratory: negative for SOB or persistent cough Gastrointestinal: negative for abdominal pain, change in bowel habits or melena Genitourinary: negative for dysuria or gross hematuria, no abnormal uterine bleeding or disharge Musculoskeletal: negative for new gait disturbance or muscular weakness Integumentary: negative for new or persistent rashes, no breast lumps Neurological: negative for TIA or stroke symptoms Psychiatric: negative for SI or delusions Allergic/Immunologic: negative for hives  Patient Care Team     Relationship Specialty Notifications Start End  Leamon Arnt, MD PCP - General Family Medicine  12/14/17   Joya San, OD Consulting Physician Optometry  07/21/19     Objective  Vitals: BP 132/72 Comment: by consistent home bp log  Pulse 78   Temp 98.3 F (36.8 C) (Temporal)   Resp 14   Ht 5' 3.25" (1.607 m)   Wt 136 lb 9.6 oz (62 kg)   SpO2 94%   BMI 24.01 kg/m  General:  Well developed, well nourished, no acute distress  Psych:  Alert and orientedx3,normal mood and affect HEENT:  Normocephalic, atraumatic, non-icteric sclera,  supple neck without adenopathy, mass or thyromegaly Cardiovascular:  Normal S1, S2, RRR without gallop, rub or murmur Respiratory:  Good breath sounds bilaterally, CTAB with normal respiratory effort Gastrointestinal: normal bowel sounds, soft, non-tender, no noted masses. No HSM MSK: no deformities, contusions. Joints are without erythema or swelling.  Skin:  Warm, no rashes or suspicious lesions noted Neurologic:    Mental status is normal. CN 2-11 are normal. Gross motor and sensory exams are normal. Normal gait. No tremor Breast Exam: No mass, skin retraction or nipple discharge is appreciated in either breast. No axillary adenopathy. Fibrocystic changes are not noted  Lab Results  Component Value Date   TSH 1.08 12/14/2017      Commons  side effects, risks, benefits, and alternatives for medications and treatment plan prescribed today were discussed, and the patient expressed understanding of the given instructions. Patient is instructed to call or message via MyChart if he/she has any questions or concerns regarding our treatment plan. No barriers to understanding were identified. We discussed Red Flag symptoms and signs in detail. Patient expressed understanding regarding what to do in case of urgent or emergency type symptoms.   Medication list was reconciled, printed and provided to the patient in AVS. Patient instructions and summary  information was reviewed with the patient as documented in the AVS. This note was prepared with assistance of Dragon voice recognition software. Occasional wrong-word or sound-a-like substitutions may have occurred due to the inherent limitations of voice recognition software  This visit occurred during the SARS-CoV-2 public health emergency.  Safety protocols were in place, including screening questions prior to the visit, additional usage of staff PPE, and extensive cleaning of exam room while observing appropriate contact time as indicated for disinfecting solutions.

## 2019-09-27 LAB — LIPID PANEL
Cholesterol: 256 mg/dL — ABNORMAL HIGH (ref 0–200)
HDL: 87.2 mg/dL (ref >39.00)
LDL Cholesterol: 154 mg/dL — ABNORMAL HIGH (ref 0–99)
NonHDL: 168.93
Total CHOL/HDL Ratio: 3
Triglycerides: 76 mg/dL (ref 0.0–149.0)
VLDL: 15.2 mg/dL (ref 0.0–40.0)

## 2019-09-27 LAB — CBC WITH DIFFERENTIAL/PLATELET
Basophils Absolute: 0 10*3/uL (ref 0.0–0.1)
Basophils Relative: 0.7 % (ref 0.0–3.0)
Eosinophils Absolute: 0.1 10*3/uL (ref 0.0–0.7)
Eosinophils Relative: 1.3 % (ref 0.0–5.0)
HCT: 41.5 % (ref 36.0–46.0)
Hemoglobin: 14.1 g/dL (ref 12.0–15.0)
Lymphocytes Relative: 32.1 % (ref 12.0–46.0)
Lymphs Abs: 1.7 10*3/uL (ref 0.7–4.0)
MCHC: 33.9 g/dL (ref 30.0–36.0)
MCV: 95 fl (ref 78.0–100.0)
Monocytes Absolute: 0.6 10*3/uL (ref 0.1–1.0)
Monocytes Relative: 11.3 % (ref 3.0–12.0)
Neutro Abs: 2.9 10*3/uL (ref 1.4–7.7)
Neutrophils Relative %: 54.6 % (ref 43.0–77.0)
Platelets: 217 10*3/uL (ref 150.0–400.0)
RBC: 4.37 Mil/uL (ref 3.87–5.11)
RDW: 12.8 % (ref 11.5–15.5)
WBC: 5.3 10*3/uL (ref 4.0–10.5)

## 2019-09-27 LAB — COMPREHENSIVE METABOLIC PANEL
ALT: 23 U/L (ref 0–35)
AST: 27 U/L (ref 0–37)
Albumin: 4.4 g/dL (ref 3.5–5.2)
Alkaline Phosphatase: 66 U/L (ref 39–117)
BUN: 10 mg/dL (ref 6–23)
CO2: 28 mEq/L (ref 19–32)
Calcium: 9.7 mg/dL (ref 8.4–10.5)
Chloride: 99 mEq/L (ref 96–112)
Creatinine, Ser: 0.78 mg/dL (ref 0.40–1.20)
GFR: 73.33 mL/min (ref >60.00)
Glucose, Bld: 93 mg/dL (ref 70–99)
Potassium: 4.1 mEq/L (ref 3.5–5.1)
Sodium: 134 mEq/L — ABNORMAL LOW (ref 135–145)
Total Bilirubin: 0.6 mg/dL (ref 0.2–1.2)
Total Protein: 6.4 g/dL (ref 6.0–8.3)

## 2019-09-27 LAB — HEPATITIS C ANTIBODY
Hepatitis C Ab: NONREACTIVE
SIGNAL TO CUT-OFF: 0 (ref <1.00)

## 2019-09-27 LAB — TSH: TSH: 1.16 u[IU]/mL (ref 0.35–4.50)

## 2019-09-27 MED ORDER — LEVOTHYROXINE SODIUM 100 MCG PO TABS: ORAL_TABLET | ORAL | 3 refills | Status: DC

## 2019-09-27 NOTE — Addendum Note (Signed)
Addended by: Asencion Partridge on: 09/27/2019 12:13 PM   Modules accepted: Orders

## 2019-10-11 ENCOUNTER — Encounter: Payer: Self-pay | Admitting: Family Medicine

## 2019-10-12 ENCOUNTER — Encounter: Payer: Self-pay | Admitting: Family Medicine

## 2019-11-18 ENCOUNTER — Ambulatory Visit: Payer: Medicare Other

## 2019-11-18 ENCOUNTER — Other Ambulatory Visit: Payer: Medicare Other

## 2019-11-23 ENCOUNTER — Ambulatory Visit
Admission: RE | Admit: 2019-11-23 | Discharge: 2019-11-23 | Disposition: A | Discharge status: Home / Self Care | Payer: Medicare Other | Source: Ambulatory Visit | Attending: Family Medicine | Admitting: Family Medicine

## 2019-11-23 ENCOUNTER — Other Ambulatory Visit: Payer: Self-pay

## 2019-11-23 DIAGNOSIS — Z1231 Encounter for screening mammogram for malignant neoplasm of breast: Secondary | ICD-10-CM

## 2019-11-23 DIAGNOSIS — E2839 Other primary ovarian failure: Secondary | ICD-10-CM

## 2019-11-23 DIAGNOSIS — Z780 Asymptomatic menopausal state: Secondary | ICD-10-CM | POA: Diagnosis not present

## 2019-11-23 DIAGNOSIS — M85851 Other specified disorders of bone density and structure, right thigh: Secondary | ICD-10-CM | POA: Diagnosis not present

## 2019-11-23 DIAGNOSIS — M810 Age-related osteoporosis without current pathological fracture: Secondary | ICD-10-CM | POA: Diagnosis not present

## 2019-11-28 ENCOUNTER — Encounter: Payer: Self-pay | Admitting: Family Medicine

## 2019-11-28 DIAGNOSIS — M810 Age-related osteoporosis without current pathological fracture: Secondary | ICD-10-CM

## 2019-11-28 HISTORY — DX: Age-related osteoporosis without current pathological fracture: M81.0

## 2019-12-08 ENCOUNTER — Encounter: Payer: Self-pay | Admitting: Family Medicine

## 2019-12-09 ENCOUNTER — Other Ambulatory Visit: Payer: Self-pay

## 2019-12-09 MED ORDER — LISINOPRIL 5 MG PO TABS: 5.0000 mg | ORAL_TABLET | Freq: Every day | ORAL | 3 refills | Status: DC

## 2019-12-13 ENCOUNTER — Other Ambulatory Visit: Payer: Self-pay

## 2019-12-13 ENCOUNTER — Other Ambulatory Visit: Payer: Medicare Other

## 2019-12-13 DIAGNOSIS — Z20822 Contact with and (suspected) exposure to covid-19: Secondary | ICD-10-CM

## 2019-12-14 ENCOUNTER — Other Ambulatory Visit: Payer: Self-pay

## 2019-12-14 ENCOUNTER — Encounter: Payer: Self-pay | Admitting: Family Medicine

## 2019-12-14 LAB — NOVEL CORONAVIRUS, NAA: SARS-CoV-2, NAA: NOT DETECTED

## 2019-12-14 MED ORDER — LISINOPRIL 5 MG PO TABS: 5.0000 mg | ORAL_TABLET | Freq: Every day | ORAL | 3 refills | Status: DC

## 2020-01-23 DIAGNOSIS — Z23 Encounter for immunization: Secondary | ICD-10-CM | POA: Diagnosis not present

## 2020-01-24 DIAGNOSIS — H2513 Age-related nuclear cataract, bilateral: Secondary | ICD-10-CM | POA: Diagnosis not present

## 2020-02-03 ENCOUNTER — Encounter: Payer: Self-pay | Admitting: Family Medicine

## 2020-02-07 DIAGNOSIS — Z23 Encounter for immunization: Secondary | ICD-10-CM | POA: Diagnosis not present

## 2020-02-22 DIAGNOSIS — Z20822 Contact with and (suspected) exposure to covid-19: Secondary | ICD-10-CM | POA: Diagnosis not present

## 2020-02-24 ENCOUNTER — Other Ambulatory Visit: Payer: Self-pay

## 2020-06-04 ENCOUNTER — Encounter: Payer: Self-pay | Admitting: Family Medicine

## 2020-06-26 ENCOUNTER — Other Ambulatory Visit: Payer: Self-pay

## 2020-06-26 ENCOUNTER — Encounter: Payer: Self-pay | Admitting: Family Medicine

## 2020-06-26 ENCOUNTER — Ambulatory Visit (INDEPENDENT_AMBULATORY_CARE_PROVIDER_SITE_OTHER): Payer: Medicare Other | Admitting: Family Medicine

## 2020-06-26 VITALS — BP 120/70 | HR 88 | Temp 97.6°F | Resp 16 | Ht 63.0 in | Wt 140.6 lb

## 2020-06-26 DIAGNOSIS — M4722 Other spondylosis with radiculopathy, cervical region: Secondary | ICD-10-CM

## 2020-06-26 DIAGNOSIS — Z981 Arthrodesis status: Secondary | ICD-10-CM

## 2020-06-26 DIAGNOSIS — M5412 Radiculopathy, cervical region: Secondary | ICD-10-CM

## 2020-06-26 MED ORDER — PREDNISONE 10 MG PO TABS
ORAL_TABLET | ORAL | 0 refills | Status: DC
Start: 2020-06-26 — End: 2020-08-27

## 2020-06-26 NOTE — Progress Notes (Signed)
Subjective  CC:  Chief Complaint  Patient presents with  . Arm Pain    Left arm and hand  . Spasms    Neck spasms     HPI: Mercedes Briggs is a 70 y.o. female who presents to the office today to address the problems listed above in the chief complaint.  Please refer to her recent MyChart message.  I reviewed MRI from 2015 showing osteoarthritic changes and herniated disc at C7.  She is status post discectomy and C6-7 fusion that year.  70 year old active female reports of the last 2 months she is experiencing more left arm pain.  Pain shoots down the backside of her arm into the hand, complains of daily aching and possible weakness of grip.  She has mild neck aches mainly on the left.  At times she will have sharp pain with certain movements on the right that lasts moments.  No radiation to the right arm.  She sleeps well and is unaffected by pain at night.  She is only taken 3 Advil 1 time in the last 2 to 3 months, this did relieve her pain.  She prefers not to use medications unless needed.  She denies chest pain or shortness of breath.  She has several questions.   Assessment  1. Cervical radiculopathy   2. History of fusion of cervical spine   3. Osteoarthritis of spine with radiculopathy, cervical region      Plan   Cervical radiculopathy and mild DJD: Mild.  No red flag symptoms.  Mostly normal exam today.  Recommend prednisone taper and monitoring symptoms.  She can return for x-rays if persist or worsens.  Follow up: June for complete physical, sooner if neck pain persists. Visit date not found  No orders of the defined types were placed in this encounter.  Meds ordered this encounter  Medications  . predniSONE (DELTASONE) 10 MG tablet    Sig: Take 4 tabs qd x 2 days, 3 qd x 2 days, 2 qd x 2d, 1qd x 3 days    Dispense:  21 tablet    Refill:  0      I reviewed the patients updated PMH, FH, and SocHx.    Patient Active Problem List   Diagnosis Date Noted   . Age related osteoporosis 11/28/2019  . Refusal of statin medication by patient 09/26/2019  . Acquired hypothyroidism 12/06/2015  . Essential hypertension 12/06/2015  . History of fusion of cervical spine 12/06/2015  . Mixed hyperlipidemia 12/06/2015   Current Meds  Medication Sig  . Ascorbic Acid (VITAMIN C) 1000 MG tablet Take 1,000 mg by mouth daily.  Marland Kitchen b complex vitamins capsule Take 1 capsule by mouth daily.  . Coenzyme Q-10 100 MG capsule Take 100 mg by mouth daily.  Marland Kitchen levothyroxine (SYNTHROID) 100 MCG tablet TAKE 1 TABLET BY MOUTH EVERY MORNING BEFORE BREAKFAST  . lisinopril (ZESTRIL) 5 MG tablet Take 1 tablet (5 mg total) by mouth daily.  . Magnesium 125 MG CAPS Take by mouth.  . Omega-3 Fatty Acids (OMEGA-3 2100 PO) Take by mouth.  . predniSONE (DELTASONE) 10 MG tablet Take 4 tabs qd x 2 days, 3 qd x 2 days, 2 qd x 2d, 1qd x 3 days    Allergies: Patient is allergic to penicillins. Family History: Patient family history includes Alzheimer's disease in her mother; Arthritis in her mother; Asthma in her father; Atrial fibrillation in her mother; COPD in her father; Dementia in her mother; Diabetes in her  brother; Heart disease in her father; Hypertension in her father and mother; Neurodegenerative disease in her brother. Social History:  Patient  reports that she has quit smoking. She has never used smokeless tobacco. She reports current alcohol use. She reports that she does not use drugs.  Review of Systems: Constitutional: Negative for fever malaise or anorexia Cardiovascular: negative for chest pain Respiratory: negative for SOB or persistent cough Gastrointestinal: negative for abdominal pain  Objective  Vitals: BP 120/70   Pulse 88   Temp 97.6 F (36.4 C) (Temporal)   Resp 16   Ht 5\' 3"  (1.6 m)   Wt 140 lb 9.6 oz (63.8 kg)   SpO2 99%   BMI 24.91 kg/m  General: no acute distress , A&Ox3 Neck: Nontender, normal range of motion, negative Spurling test  bilaterally, Right shoulder: Full range of motion without pain Neuro: Strength: 5 out of 5 throughout bilateral upper extremities including grip strength.    Commons side effects, risks, benefits, and alternatives for medications and treatment plan prescribed today were discussed, and the patient expressed understanding of the given instructions. Patient is instructed to call or message via MyChart if he/she has any questions or concerns regarding our treatment plan. No barriers to understanding were identified. We discussed Red Flag symptoms and signs in detail. Patient expressed understanding regarding what to do in case of urgent or emergency type symptoms.   Medication list was reconciled, printed and provided to the patient in AVS. Patient instructions and summary information was reviewed with the patient as documented in the AVS. This note was prepared with assistance of Dragon voice recognition software. Occasional wrong-word or sound-a-like substitutions may have occurred due to the inherent limitations of voice recognition software  This visit occurred during the SARS-CoV-2 public health emergency.  Safety protocols were in place, including screening questions prior to the visit, additional usage of staff PPE, and extensive cleaning of exam room while observing appropriate contact time as indicated for disinfecting solutions.

## 2020-06-26 NOTE — Patient Instructions (Signed)
Please return in June for your annual complete physical; please come fasting. Sooner if your arm pain persists or worsens. We can schedule an xray appointment if needed as well.   If you have any questions or concerns, please don't hesitate to send me a message via MyChart or call the office at [phone]. Thank you for visiting with [place] today! It's our pleasure caring for you.   Cervical Radiculopathy  Cervical radiculopathy happens when a nerve in the neck (a cervical nerve) is pinched or bruised. This condition can happen because of an injury to the cervical spine (vertebrae) in the neck, or as part of the normal aging process. Pressure on the cervical nerves can cause pain or numbness that travels from the neck all the way down into the arm and fingers. Usually, this condition gets better with rest. Treatment may be needed if the condition does not improve. What are the causes? This condition may be caused by:  A neck injury.  A bulging (herniated) disk.  Muscle spasms.  Muscle tightness in the neck because of overuse.  Arthritis.  Breakdown or degeneration in the bones and joints of the spine (spondylosis) due to aging.  Bone spurs that may develop near the cervical nerves. What are the signs or symptoms? Symptoms of this condition include:  Pain. The pain may travel from the neck to the arm and hand. The pain can be severe or irritating. It may be worse when you move your neck.  Numbness or tingling in your arm or hand.  Weakness in the affected arm and hand, in severe cases. How is this diagnosed? This condition may be diagnosed based on your symptoms, your medical history, and a physical exam. You may also have tests, including:  X-rays.  A CT scan.  An MRI.  An electromyogram (EMG).  Nerve conduction tests. How is this treated? In many cases, treatment is not needed for this condition. With rest, the condition usually gets better over time. If treatment is  needed, options may include:  Wearing a soft neck collar (cervical collar) for short periods of time, as told by your health care provider.  Doing physical therapy to strengthen your neck muscles.  Taking medicines, such as NSAIDs or oral corticosteroids.  Having spinal injections, in severe cases.  Having surgery. This may be needed if other treatments do not help. Different types of surgery may be done depending on the cause of this condition. Follow these instructions at home: If you have a cervical collar:  Wear it as told by your health care provider. Remove it only as told by your health care provider.  Ask your health care provider if you can remove the collar for cleaning and bathing. If you are allowed to remove the collar for cleaning or bathing: ? Follow instructions from your health care provider about how to remove the collar safely. ? Clean the collar by wiping it with mild soap and water and drying it completely. ? Take out any removable pads in the collar every 1-2 days, and wash them by hand with soap and water. Let them air-dry completely before you put them back in the collar. ? Check your skin under the collar for irritation or sores. If you see any, tell your health care provider. Managing pain  Take over-the-counter and prescription medicines only as told by your health care provider.  If directed, put ice on the affected area. ? If you have a soft neck collar, remove it as told  by your health care provider. ? Put ice in a plastic bag. ? Place a towel between your skin and the bag. ? Leave the ice on for 20 minutes, 2-3 times a day.  If applying ice does not help, you can try using heat. Use the heat source that your health care provider recommends, such as a moist heat pack or a heating pad. ? Place a towel between your skin and the heat source. ? Leave the heat on for 20-30 minutes. ? Remove the heat if your skin turns bright red. This is especially important  if you are unable to feel pain, heat, or cold. You may have a greater risk of getting burned.  Try a gentle neck and shoulder massage to help relieve symptoms.      Activity  Rest as needed.  Return to your normal activities as told by your health care provider. Ask your health care provider what activities are safe for you.  Do stretching and strengthening exercises as told by your health care provider or physical therapist.  Do not lift anything that is heavier than 10 lb (4.5 kg) until your health care provider tells you that it is safe. General instructions  Use a flat pillow when you sleep.  Do not drive while wearing a cervical collar. If you do not have a cervical collar, ask your health care provider if it is safe to drive while your neck heals.  Ask your health care provider if the medicine prescribed to you requires you to avoid driving or using heavy machinery.  Do not use any products that contain nicotine or tobacco, such as cigarettes, e-cigarettes, and chewing tobacco. These can delay healing. If you need help quitting, ask your health care provider.  Keep all follow-up visits as told by your health care provider. This is important. Contact a health care provider if:  Your condition does not improve with treatment. Get help right away if:  Your pain gets much worse and cannot be controlled with medicines.  You have weakness or numbness in your hand, arm, face, or leg.  You have a high fever.  You have a stiff, rigid neck.  You lose control of your bowels or your bladder (have incontinence).  You have trouble with walking, balance, or speaking. Summary  Cervical radiculopathy happens when a nerve in the neck is pinched or bruised.  A nerve can get pinched from a bulging disk, arthritis, muscle spasms, or an injury to the neck.  Symptoms include pain, tingling, or numbness radiating from the neck into the arm or hand. Weakness can also occur in severe  cases.  Treatment may include rest, wearing a cervical collar, and physical therapy. Medicines may be prescribed to help with pain. In severe cases, injections or surgery may be needed. This information is not intended to replace advice given to you by your health care provider. Make sure you discuss any questions you have with your health care provider. Document Revised: 02/12/2018 Document Reviewed: 02/12/2018 Elsevier Patient Education  2021 [employer].

## 2020-07-15 ENCOUNTER — Encounter: Payer: Self-pay | Admitting: Family Medicine

## 2020-08-27 ENCOUNTER — Ambulatory Visit (INDEPENDENT_AMBULATORY_CARE_PROVIDER_SITE_OTHER): Payer: Medicare Other

## 2020-08-27 DIAGNOSIS — Z0000 Encounter for general adult medical examination without abnormal findings: Secondary | ICD-10-CM | POA: Diagnosis not present

## 2020-08-27 NOTE — Progress Notes (Addendum)
Virtual Visit via Telephone Note  I connected with  Mercedes Briggs on 08/27/20 at  1:45 PM EDT by telephone and verified that I am speaking with the correct person using two identifiers.  Medicare Annual Wellness visit completed telephonically due to Covid-19 pandemic.   Persons participating in this call: This Health Coach and this patient.   Location: Patient: Home Provider: office    I discussed the limitations, risks, security and privacy concerns of performing an evaluation and management service by telephone and the availability of in person appointments. The patient expressed understanding and agreed to proceed.  Unable to perform video visit due to video visit attempted and failed and/or patient does not have video capability.   Some vital signs may be absent or patient reported.   [name], LPN    Subjective:   Mercedes Briggs is a 70 y.o. female who presents for Medicare Annual (Subsequent) preventive examination.  Review of Systems     Cardiac Risk Factors include: advanced age (>51men, >5 women);hypertension;dyslipidemia     Objective:    There were no vitals filed for this visit. There is no height or weight on file to calculate BMI.  Advanced Directives 07/21/2019  Does Patient Have a Medical Advance Directive? Yes  Type of Advance Directive Living will;Healthcare Power of Attorney  Does patient want to make changes to medical advance directive? No - Patient declined  Copy of Healthcare Power of Attorney in Chart? No - copy requested    Current Medications (verified) Outpatient Encounter Medications as of 08/27/2020  Medication Sig  . Ascorbic Acid (VITAMIN C) 1000 MG tablet Take 1,000 mg by mouth daily.  Marland Kitchen b complex vitamins capsule Take 1 capsule by mouth daily.  Marland Kitchen CALCIUM CITRATE PO Take by mouth.  . Coenzyme Q-10 100 MG capsule Take 100 mg by mouth daily.  Marland Kitchen levothyroxine (SYNTHROID) 100 MCG tablet TAKE 1 TABLET BY MOUTH EVERY  MORNING BEFORE BREAKFAST  . lisinopril (ZESTRIL) 5 MG tablet Take 1 tablet (5 mg total) by mouth daily.  . Magnesium 125 MG CAPS Take by mouth.  . Omega-3 Fatty Acids (OMEGA-3 2100 PO) Take by mouth.  Marland Kitchen VITAMIN D PO Take by mouth.  . [DISCONTINUED] predniSONE (DELTASONE) 10 MG tablet Take 4 tabs qd x 2 days, 3 qd x 2 days, 2 qd x 2d, 1qd x 3 days   No facility-administered encounter medications on file as of 08/27/2020.    Allergies (verified) Penicillins   History: Past Medical History:  Diagnosis Date  . 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 Offer treatment.   Marland Kitchen Herniated disc, cervical   . Hyperlipidemia   . Hypertension   . Thyroid disease    Past Surgical History:  Procedure Laterality Date  . ABDOMINAL HYSTERECTOMY    . ANTERIOR CERVICAL DISCECTOMY    . CESAREAN SECTION    . TONSILLECTOMY     Family History  Problem Relation Age of Onset  . Diabetes Brother   . Neurodegenerative disease Brother        storage cell disease  . Alzheimer's disease Mother   . Arthritis Mother   . Atrial fibrillation Mother   . Dementia Mother   . Hypertension Mother   . Asthma Father   . COPD Father   . Heart disease Father   . Hypertension Father    Social History   Socioeconomic History  . Marital status: Married    Spouse name: Not  on file  . Number of children: Not on file  . Years of education: Not on file  . Highest education level: Not on file  Occupational History  . Occupation: Retired     Comment: Doctor, general practice   Tobacco Use  . Smoking status: Former Games developer  . Smokeless tobacco: Never Used  . Tobacco comment: occasionally   Vaping Use  . Vaping Use: Never used  Substance and Sexual Activity  . Alcohol use: Yes    Comment: socially  . Drug use: Never  . Sexual activity: Yes    Birth control/protection: Post-menopausal  Other Topics Concern  . Not on file  Social History Narrative   1 daughter that lives in  [place]    Social Determinants of Health   Financial Resource Strain: Low Risk   . Difficulty of Paying Living Expenses: Not hard at all  Food Insecurity: No Food Insecurity  . Worried About Programme researcher, broadcasting/film/video in the Last Year: Never true  . Ran Out of Food in the Last Year: Never true  Transportation Needs: No Transportation Needs  . Lack of Transportation (Medical): No  . Lack of Transportation (Non-Medical): No  Physical Activity: Sufficiently Active  . Days of Exercise per Week: 5 days  . Minutes of Exercise per Session: 50 min  Stress: No Stress Concern Present  . Feeling of Stress : Only a little  Social Connections: Moderately Integrated  . Frequency of Communication with Friends and Family: More than three times a week  . Frequency of Social Gatherings with Friends and Family: More than three times a week  . Attends Religious Services: Never  . Active Member of Clubs or Organizations: Yes  . Attends Banker Meetings: 1 to 4 times per year  . Marital Status: Married    Tobacco Counseling Counseling given: Not Answered Comment: occasionally    Clinical Intake:  Pre-visit preparation completed: Yes  Pain : 0-10 Pain Location: Foot Pain Orientation: Left (pt [facility]) Pain Descriptors / Indicators: Aching,Dull,Other (Comment) (like fire) Pain Onset: More than a month ago Pain Frequency: Constant     BMI - recorded: 24.91 Nutritional Status: BMI of 19-24  Normal Nutritional Risks: None Diabetes: No  How often do you need to have someone help you when you read instructions, pamphlets, or other written materials from your doctor or pharmacy?: 1 - Never  Diabetic?No  Interpreter Needed?: No  Information entered by :: Lanier Ensign, LPN   Activities of Daily Living In your present state of health, do you have any difficulty performing the following activities: 08/27/2020  Hearing? N  Vision? N  Difficulty  concentrating or making decisions? N  Walking or climbing stairs? N  Dressing or bathing? N  Doing errands, shopping? N  Preparing Food and eating ? N  Using the Toilet? N  In the past six months, have you accidently leaked urine? N  Do you have problems with loss of bowel control? N  Managing your Medications? N  Managing your Finances? N  Housekeeping or managing your Housekeeping? N  Some recent data might be hidden    Patient Care Team: Willow Ora, MD as PCP - General (Family Medicine) Annette Stable, OD as Consulting Physician (Optometry)  Indicate any recent Medical Services you may have received from other than [facility] providers in the past year (date may be approximate).     Assessment:   This is a routine wellness examination for Mercedes Briggs.  Hearing/Vision screen  Hearing Screening   125Hz  250Hz  500Hz  1000Hz  2000Hz  3000Hz  4000Hz  6000Hz  8000Hz   Right ear:           Left ear:           Comments: Denies any hearing issues   Vision Screening Comments: Pt follows up with [facility] for exams   Dietary issues and exercise activities discussed: Current Exercise Habits: Home exercise routine, Type of exercise: treadmill;walking, Time (Minutes): 45, Frequency (Times/Week): 5, Weekly Exercise (Minutes/Week): 225  Goals Addressed            This Visit's Progress   . Patient Stated       Stay healthy      Depression Screen PHQ 2/9 Scores 08/27/2020 07/21/2019 12/20/2018 12/14/2017  PHQ - 2 Score 1 0 0 0    Fall Risk Fall Risk  08/27/2020 07/21/2019 12/20/2018 12/14/2017  Falls in the past year? 0 0 0 No  Number falls in past yr: 0 0 0 -  Injury with Fall? 0 0 0 -  Risk for fall due to : Impaired vision - - -  Follow up Falls prevention discussed Falls evaluation completed;Education provided;Falls prevention discussed Falls evaluation completed -    FALL RISK PREVENTION PERTAINING TO THE HOME:  Any stairs in or around the home? Yes  If so, are there any without  handrails? No  Home free of loose throw rugs in walkways, pet beds, electrical cords, etc? Yes  Adequate lighting in your home to reduce risk of falls? Yes   ASSISTIVE DEVICES UTILIZED TO PREVENT FALLS:  Life alert? No  Use of a cane, walker or w/c? No  Grab bars in the bathroom? Yes  Shower chair or bench in shower? Yes  Elevated toilet seat or a handicapped toilet? Yes   TIMED UP AND GO:  Was the test performed? No .    Cognitive Function:     6CIT Screen 08/27/2020 07/21/2019  What Year? 0 points 0 points  What month? 0 points 0 points  What time? - 0 points  Count back from 20 0 points 0 points  Months in reverse 0 points 0 points  Repeat phrase 0 points 0 points  Total Score - 0    Immunizations Immunization History  Administered Date(s) Administered  . Influenza, High Dose Seasonal PF 04/19/2016  . Moderna Sars-Covid-2 Vaccination 05/20/2019, 06/17/2019, 02/07/2020  . Pneumococcal Conjugate-13 04/19/2016  . Pneumococcal Polysaccharide-23 12/14/2017  . Zoster 12/06/2015      Flu Vaccine status: Up to date  Pneumococcal vaccine status: Up to date  Covid-19 vaccine status: Completed vaccines  Qualifies for Shingles Vaccine? Yes   Zostavax completed Yes   Shingrix Completed?: No.    Education has been provided regarding the importance of this vaccine. Patient has been advised to call insurance company to determine out of pocket expense if they have not yet received this vaccine. Advised may also receive vaccine at local pharmacy or Health Dept. Verbalized acceptance and understanding.  Screening Tests Health Maintenance  Topic Date Due  . INFLUENZA VACCINE  11/05/2020  . MAMMOGRAM  11/22/2020  . DEXA SCAN  11/22/2021  . Fecal DNA (Cologuard)  08/12/2022  . COVID-19 Vaccine  Completed  . Hepatitis C Screening  Completed  . PNA vac Low Risk Adult  Completed  . HPV VACCINES  Aged Out    Health Maintenance  There are no preventive care reminders to  display for this patient.  Colorectal cancer screening:  Type of screening: Cologuard. Completed 08/12/19. Repeat every 3 years  Mammogram status: Completed 11/23/19. Repeat every year  Bone Density status: Completed 11/23/19. Results reflect: Bone density results: OSTEOPOROSIS. Repeat every 2 years.  Additional Screening:  Hepatitis C Screening: Completed 09/26/19  Vision Screening: Recommended annual ophthalmology exams for early detection of glaucoma and other disorders of the eye. Is the patient up to date with their annual eye exam?  Yes  Who is the provider or what is the name of the office in which the patient attends annual eye exams? [facility]  If pt is not established with a provider, would they like to be referred to a provider to establish care? No .   Dental Screening: Recommended annual dental exams for proper oral hygiene  Community Resource Referral / Chronic Care Management: CRR required this visit?  No   CCM required this visit?  No      Plan:     I have personally reviewed and noted the following in the patient's chart:   . Medical and social history . Use of alcohol, tobacco or illicit drugs  . Current medications and supplements including opioid prescriptions.  . Functional ability and status . Nutritional status . Physical activity . Advanced directives . List of other physicians . Hospitalizations, surgeries, and ER visits in previous 12 months . Vitals . Screenings to include cognitive, depression, and falls . Referrals and appointments  In addition, I have reviewed and discussed with patient certain preventive protocols, quality metrics, and best practice recommendations. A written personalized care plan for preventive services as well as general preventive health recommendations were provided to patient.     [name], LPN   04/08/5850   Nurse Notes: None

## 2020-08-27 NOTE — Patient Instructions (Addendum)
Mercedes Briggs , Thank you for taking time to come for your Medicare Wellness Visit. I appreciate your ongoing commitment to your health goals. Please review the following plan we discussed and let me know if I can assist you in the future.   Screening recommendations/referrals: Colonoscopy: Done 08/12/19 cologuard Mammogram: Done 11/23/19 Bone Density: Done 11/23/19 Recommended yearly ophthalmology/optometry visit for glaucoma screening and checkup Recommended yearly dental visit for hygiene and checkup  Vaccinations: Influenza vaccine: Up to date Pneumococcal vaccine: Up to date Shingles vaccine: Shingrix discussed. Please contact your pharmacy for coverage information.    Covid-19:Completed 2/12, 3/12, & 02/07/20  Advanced directives: Copies in chart  Conditions/risks identified: Stay Healthy   Next appointment: Follow up in one year for your annual wellness visit    Preventive Care 65 Years and Older, Female Preventive care refers to lifestyle choices and visits with your health care provider that can promote health and wellness. What does preventive care include?  A yearly physical exam. This is also called an annual well check.  Dental exams once or twice a year.  Routine eye exams. Ask your health care provider how often you should have your eyes checked.  Personal lifestyle choices, including:  Daily care of your teeth and gums.  Regular physical activity.  Eating a healthy diet.  Avoiding tobacco and drug use.  Limiting alcohol use.  Practicing safe sex.  Taking low-dose aspirin every day.  Taking vitamin and mineral supplements as recommended by your health care provider. What happens during an annual well check? The services and screenings done by your health care provider during your annual well check will depend on your age, overall health, lifestyle risk factors, and family history of disease. Counseling  Your health care provider may ask you  questions about your:  Alcohol use.  Tobacco use.  Drug use.  Emotional well-being.  Home and relationship well-being.  Sexual activity.  Eating habits.  History of falls.  Memory and ability to understand (cognition).  Work and work Astronomer.  Reproductive health. Screening  You may have the following tests or measurements:  Height, weight, and BMI.  Blood pressure.  Lipid and cholesterol levels. These may be checked every 5 years, or more frequently if you are over 27 years old.  Skin check.  Lung cancer screening. You may have this screening every year starting at age 102 if you have a 30-pack-year history of smoking and currently smoke or have quit within the past 15 years.  Fecal occult blood test (FOBT) of the stool. You may have this test every year starting at age 16.  Flexible sigmoidoscopy or colonoscopy. You may have a sigmoidoscopy every 5 years or a colonoscopy every 10 years starting at age 69.  Hepatitis C blood test.  Hepatitis B blood test.  Sexually transmitted disease (STD) testing.  Diabetes screening. This is done by checking your blood sugar (glucose) after you have not eaten for a while (fasting). You may have this done every 1-3 years.  Bone density scan. This is done to screen for osteoporosis. You may have this done starting at age 37.  Mammogram. This may be done every 1-2 years. Talk to your health care provider about how often you should have regular mammograms. Talk with your health care provider about your test results, treatment options, and if necessary, the need for more tests. Vaccines  Your health care provider may recommend certain vaccines, such as:  Influenza vaccine. This is recommended every year.  Tetanus,  diphtheria, and acellular pertussis (Tdap, Td) vaccine. You may need a Td booster every 10 years.  Zoster vaccine. You may need this after age 32.  Pneumococcal 13-valent conjugate (PCV13) vaccine. One dose is  recommended after age 83.  Pneumococcal polysaccharide (PPSV23) vaccine. One dose is recommended after age 62. Talk to your health care provider about which screenings and vaccines you need and how often you need them. This information is not intended to replace advice given to you by your health care provider. Make sure you discuss any questions you have with your health care provider. Document Released: 04/20/2015 Document Revised: 12/12/2015 Document Reviewed: 01/23/2015 Elsevier Interactive Patient Education  2017 [place] Prevention in the Home Falls can cause injuries. They can happen to people of all ages. There are many things you can do to make your home safe and to help prevent falls. What can I do on the outside of my home?  Regularly fix the edges of walkways and driveways and fix any cracks.  Remove anything that might make you trip as you walk through a door, such as a raised step or threshold.  Trim any bushes or trees on the path to your home.  Use bright outdoor lighting.  Clear any walking paths of anything that might make someone trip, such as rocks or tools.  Regularly check to see if handrails are loose or broken. Make sure that both sides of any steps have handrails.  Any raised decks and porches should have guardrails on the edges.  Have any leaves, snow, or ice cleared regularly.  Use sand or salt on walking paths during winter.  Clean up any spills in your garage right away. This includes oil or grease spills. What can I do in the bathroom?  Use night lights.  Install grab bars by the toilet and in the tub and shower. Do not use towel bars as grab bars.  Use non-skid mats or decals in the tub or shower.  If you need to sit down in the shower, use a plastic, non-slip stool.  Keep the floor dry. Clean up any water that spills on the floor as soon as it happens.  Remove soap buildup in the tub or shower regularly.  Attach bath mats  securely with double-sided non-slip rug tape.  Do not have throw rugs and other things on the floor that can make you trip. What can I do in the bedroom?  Use night lights.  Make sure that you have a light by your bed that is easy to reach.  Do not use any sheets or blankets that are too big for your bed. They should not hang down onto the floor.  Have a firm chair that has side arms. You can use this for support while you get dressed.  Do not have throw rugs and other things on the floor that can make you trip. What can I do in the kitchen?  Clean up any spills right away.  Avoid walking on wet floors.  Keep items that you use a lot in easy-to-reach places.  If you need to reach something above you, use a strong step stool that has a grab bar.  Keep electrical cords out of the way.  Do not use floor polish or wax that makes floors slippery. If you must use wax, use non-skid floor wax.  Do not have throw rugs and other things on the floor that can make you trip. What can I do with  my stairs?  Do not leave any items on the stairs.  Make sure that there are handrails on both sides of the stairs and use them. Fix handrails that are broken or loose. Make sure that handrails are as long as the stairways.  Check any carpeting to make sure that it is firmly attached to the stairs. Fix any carpet that is loose or worn.  Avoid having throw rugs at the top or bottom of the stairs. If you do have throw rugs, attach them to the floor with carpet tape.  Make sure that you have a light switch at the top of the stairs and the bottom of the stairs. If you do not have them, ask someone to add them for you. What else can I do to help prevent falls?  Wear shoes that:  Do not have high heels.  Have rubber bottoms.  Are comfortable and fit you well.  Are closed at the toe. Do not wear sandals.  If you use a stepladder:  Make sure that it is fully opened. Do not climb a closed  stepladder.  Make sure that both sides of the stepladder are locked into place.  Ask someone to hold it for you, if possible.  Clearly mark and make sure that you can see:  Any grab bars or handrails.  First and last steps.  Where the edge of each step is.  Use tools that help you move around (mobility aids) if they are needed. These include:  Canes.  Walkers.  Scooters.  Crutches.  Turn on the lights when you go into a dark area. Replace any light bulbs as soon as they burn out.  Set up your furniture so you have a clear path. Avoid moving your furniture around.  If any of your floors are uneven, fix them.  If there are any pets around you, be aware of where they are.  Review your medicines with your doctor. Some medicines can make you feel dizzy. This can increase your chance of falling. Ask your doctor what other things that you can do to help prevent falls. This information is not intended to replace advice given to you by your health care provider. Make sure you discuss any questions you have with your health care provider. Document Released: 01/18/2009 Document Revised: 08/30/2015 Document Reviewed: 04/28/2014 Elsevier Interactive Patient Education  2017 [employer].

## 2020-08-31 ENCOUNTER — Encounter: Payer: Self-pay | Admitting: Podiatry

## 2020-08-31 ENCOUNTER — Other Ambulatory Visit: Payer: Self-pay

## 2020-08-31 ENCOUNTER — Ambulatory Visit (INDEPENDENT_AMBULATORY_CARE_PROVIDER_SITE_OTHER): Payer: Medicare Other

## 2020-08-31 ENCOUNTER — Ambulatory Visit (INDEPENDENT_AMBULATORY_CARE_PROVIDER_SITE_OTHER): Payer: Medicare Other | Admitting: Podiatry

## 2020-08-31 DIAGNOSIS — M778 Other enthesopathies, not elsewhere classified: Secondary | ICD-10-CM

## 2020-08-31 MED ORDER — TRIAMCINOLONE ACETONIDE 10 MG/ML IJ SUSP
10.0000 mg | Freq: Once | INTRAMUSCULAR | Status: AC
  Administered 2020-08-31: 10 mg

## 2020-08-31 NOTE — Progress Notes (Signed)
Subjective:   Patient ID: Mercedes Briggs, female   DOB: 70 y.o.   MRN: [id number]   HPI Patient states she has developed progressive pain in her left forefoot over the last 6 months.  It is worse at night and also when she walks gradually becoming more of an issue for her and also concerned about mild positional component of the toe and patient does not smoke likes to be active   Review of Systems  All other systems reviewed and are negative.       Objective:  Physical Exam Vitals and nursing note reviewed.  Constitutional:      Appearance: She is well-developed.  Pulmonary:     Effort: Pulmonary effort is normal.  Musculoskeletal:        General: Normal range of motion.  Skin:    General: Skin is warm.  Neurological:     Mental Status: She is alert.     Neurovascular status intact muscle strength was found to be adequate range of motion adequate.  Patient is found to have inflammation fluid buildup around the second MPJ left that is painful when pressed and makes walking difficult.  Patient is noted to have good digital perfusion well oriented x3     Assessment:  Probability for acute inflammatory capsulitis second MPJ left     Plan:  H&P reviewed condition and went ahead today did proximal nerve block aspirated the second MPJ after sterile prep and evaluated a clear fluid and injected quarter cc dexamethasone Kenalog and applied thick padding to reduce pressure on the joint surface.  Reappoint to recheck  X-rays indicate that there is no signs of fracture with mild displacement of the second digit no other pathology noted

## 2020-10-26 ENCOUNTER — Encounter: Payer: Medicare Other | Admitting: Family Medicine

## 2021-01-02 ENCOUNTER — Encounter: Payer: Self-pay | Admitting: Family Medicine

## 2021-01-02 ENCOUNTER — Other Ambulatory Visit: Payer: Self-pay

## 2021-01-02 ENCOUNTER — Ambulatory Visit (INDEPENDENT_AMBULATORY_CARE_PROVIDER_SITE_OTHER): Payer: Medicare Other | Admitting: Family Medicine

## 2021-01-02 VITALS — BP 148/90 | HR 71 | Temp 98.1°F | Ht 63.0 in | Wt 138.4 lb

## 2021-01-02 DIAGNOSIS — Z5329 Procedure and treatment not carried out because of patient's decision for other reasons: Secondary | ICD-10-CM

## 2021-01-02 DIAGNOSIS — Z1231 Encounter for screening mammogram for malignant neoplasm of breast: Secondary | ICD-10-CM

## 2021-01-02 DIAGNOSIS — E782 Mixed hyperlipidemia: Secondary | ICD-10-CM | POA: Diagnosis not present

## 2021-01-02 DIAGNOSIS — M810 Age-related osteoporosis without current pathological fracture: Secondary | ICD-10-CM

## 2021-01-02 DIAGNOSIS — I10 Essential (primary) hypertension: Secondary | ICD-10-CM | POA: Diagnosis not present

## 2021-01-02 DIAGNOSIS — E039 Hypothyroidism, unspecified: Secondary | ICD-10-CM | POA: Diagnosis not present

## 2021-01-02 DIAGNOSIS — H8110 Benign paroxysmal vertigo, unspecified ear: Secondary | ICD-10-CM | POA: Diagnosis not present

## 2021-01-02 DIAGNOSIS — Z5320 Procedure and treatment not carried out because of patient's decision for unspecified reasons: Secondary | ICD-10-CM

## 2021-01-02 LAB — CBC WITH DIFFERENTIAL/PLATELET
Basophils Absolute: 0 10*3/uL (ref 0.0–0.1)
Basophils Relative: 0.5 % (ref 0.0–3.0)
Eosinophils Absolute: 0.1 10*3/uL (ref 0.0–0.7)
Eosinophils Relative: 1.7 % (ref 0.0–5.0)
HCT: 43.1 % (ref 36.0–46.0)
Hemoglobin: 14.5 g/dL (ref 12.0–15.0)
Lymphocytes Relative: 34.4 % (ref 12.0–46.0)
Lymphs Abs: 2.2 10*3/uL (ref 0.7–4.0)
MCHC: 33.6 g/dL (ref 30.0–36.0)
MCV: 94 fl (ref 78.0–100.0)
Monocytes Absolute: 0.5 10*3/uL (ref 0.1–1.0)
Monocytes Relative: 8.3 % (ref 3.0–12.0)
Neutro Abs: 3.5 10*3/uL (ref 1.4–7.7)
Neutrophils Relative %: 55.1 % (ref 43.0–77.0)
Platelets: 254 10*3/uL (ref 150.0–400.0)
RBC: 4.58 Mil/uL (ref 3.87–5.11)
RDW: 13 % (ref 11.5–15.5)
WBC: 6.4 10*3/uL (ref 4.0–10.5)

## 2021-01-02 LAB — COMPREHENSIVE METABOLIC PANEL
ALT: 19 U/L (ref 0–35)
AST: 20 U/L (ref 0–37)
Albumin: 4.4 g/dL (ref 3.5–5.2)
Alkaline Phosphatase: 62 U/L (ref 39–117)
BUN: 10 mg/dL (ref 6–23)
CO2: 27 mEq/L (ref 19–32)
Calcium: 9.9 mg/dL (ref 8.4–10.5)
Chloride: 100 mEq/L (ref 96–112)
Creatinine, Ser: 0.72 mg/dL (ref 0.40–1.20)
GFR: 85.09 mL/min (ref >60.00)
Glucose, Bld: 87 mg/dL (ref 70–99)
Potassium: 4.2 mEq/L (ref 3.5–5.1)
Sodium: 136 mEq/L (ref 135–145)
Total Bilirubin: 0.8 mg/dL (ref 0.2–1.2)
Total Protein: 6.6 g/dL (ref 6.0–8.3)

## 2021-01-02 LAB — LIPID PANEL
Cholesterol: 234 mg/dL — ABNORMAL HIGH (ref 0–200)
HDL: 88.5 mg/dL (ref >39.00)
LDL Cholesterol: 134 mg/dL — ABNORMAL HIGH (ref 0–99)
NonHDL: 145.95
Total CHOL/HDL Ratio: 3
Triglycerides: 59 mg/dL (ref 0.0–149.0)
VLDL: 11.8 mg/dL (ref 0.0–40.0)

## 2021-01-02 LAB — TSH: TSH: 1.2 u[IU]/mL (ref 0.35–5.50)

## 2021-01-02 LAB — VITAMIN D 25 HYDROXY (VIT D DEFICIENCY, FRACTURES): VITD: 45.75 ng/mL (ref 30.00–100.00)

## 2021-01-02 MED ORDER — LISINOPRIL 5 MG PO TABS: 5.0000 mg | ORAL_TABLET | Freq: Every day | ORAL | 3 refills | Status: DC

## 2021-01-02 MED ORDER — SHINGRIX 50 MCG/0.5ML IM SUSR: 0.5000 mL | Freq: Once | INTRAMUSCULAR | 0 refills | Status: AC

## 2021-01-02 NOTE — Progress Notes (Signed)
Subjective  Chief Complaint  Patient presents with   Annual Exam    Fasting   Hyperlipidemia   Hypertension   Hypothyroidism    HPI: Mercedes Briggs is a 70 y.o. female who presents to [facility] [place] today for a Female Wellness Visit. She also has the concerns and/or needs as listed above in the chief complaint. These will be addressed in addition to the Health Maintenance Visit.   Wellness Visit: annual visit with health maintenance review and exam without Pap  HM: due mammo. Pt to schedule. Flu shot eligible. Shingrix eligible. Healthy lifestyle.   Chronic disease f/u and/or acute problem visit: (deemed necessary to be done in addition to the wellness visit): Osteoporosis: reviewed dexa - reports years ago took fosamax for 2 years for "osteopenia". No records available. It upset her stomach. Strong FH of osteoporosis - mom, sisters and brother. Worries about calcium intake and worsenign risk of heart disease.  HTN: white coat response; home readings consistently are normal. Takes lisinopril 5 daily. Avg bp 120s/70s.  HLD but refuses statin. "That's arsenic". Pt's mother had myopathy related to statins. We have discussed this in past.  C/o vertigo with head mvt especially if lying in bed at night or getting up quickly. Ongoing x 4 months. Mild sxs. No neuro sxs. Declines further eval, rehab or meds.  Hypothyroidism: taking levothyroxine and feels well. Has been well controlled.   Assessment  1. Acquired hypothyroidism   2. White coat syndrome with diagnosis of hypertension   3. Mixed hyperlipidemia   4. Benign paroxysmal positional vertigo, unspecified laterality   5. Encounter for screening mammogram for breast cancer   6. Age-related osteoporosis without current pathological fracture    7. Refusal of statin medication by patient      Plan  Female Wellness Visit: Age appropriate Health Maintenance and Prevention measures were discussed with  patient. Included topics are cancer screening recommendations, ways to keep healthy (see AVS) including dietary and exercise recommendations, regular eye and dental care, use of seat belts, and avoidance of moderate alcohol use and tobacco use.  Mammo ordered. Declines flu vaccine BMI: discussed patient's BMI and encouraged positive lifestyle modifications to help get to or maintain a target BMI. HM needs and immunizations were addressed and ordered. See below for orders. See HM and immunization section for updates. Shingrix RX given. Routine labs and screening tests ordered including cmp, cbc and lipids where appropriate. Discussed recommendations regarding Vit D and calcium supplementation (see AVS)  Chronic disease management visit and/or acute problem visit: Osteoporosis: rec prolia after education and counseling.  HTN is controlled. Check renal function and electrolytes. HLD: healthy diet. Regular exercise BPPV: behavioral mgt. She will f/u if worsens. Discussed red flag sxs. None present. Hypothyroidism; clinically euthyroid. Recheck today.   I spent a total of 48 minutes for this patient encounter. Time spent included preparation, face-to-face counseling with the patient and coordination of care, review of chart and records, and documentation of the encounter.  Follow up:  12 mo for cpe Orders Placed This Encounter  Procedures   MM DIGITAL SCREENING BILATERAL   CBC with Differential/Platelet   Comprehensive metabolic panel   Lipid panel   TSH   VITAMIN D 25 Hydroxy (Vit-D Deficiency, Fractures)   Meds ordered this encounter  Medications   lisinopril (ZESTRIL) 5 MG tablet    Sig: Take 1 tablet (5 mg total) by mouth daily.    Dispense:  90 tablet  Refill:  3   Zoster Vaccine Adjuvanted [facility]) injection    Sig: Inject 0.5 mLs into the muscle once for 1 dose. Please give 2nd dose 2-6 months after first dose    Dispense:  2 each    Refill:  0      Body mass index is  24.52 kg/m. Wt Readings from Last 3 Encounters:  01/02/21 138 lb 6.4 oz (62.8 kg)  06/26/20 140 lb 9.6 oz (63.8 kg)  09/26/19 136 lb 9.6 oz (62 kg)     Patient Active Problem List   Diagnosis Date Noted   Age related osteoporosis 11/28/2019    Dexa 11/2019: T = - 2.5 lumbar spine. Offer treatment.     Refusal of statin medication by patient 09/26/2019   Acquired hypothyroidism 12/06/2015   White coat syndrome with diagnosis of hypertension 12/06/2015   History of fusion of cervical spine 12/06/2015   Mixed hyperlipidemia 12/06/2015    Elevated ASCVD score and lipids but declines meds.     Health Maintenance  Topic Date Due   Zoster Vaccines- Shingrix (1 of 2) Never done   COVID-19 Vaccine (4 - Booster for Moderna series) 06/06/2020   MAMMOGRAM  11/22/2020   INFLUENZA VACCINE  07/05/2021 (Originally 11/05/2020)   DEXA SCAN  11/22/2021   Fecal DNA (Cologuard)  08/12/2022   Hepatitis C Screening  Completed   HPV VACCINES  Aged Out   Immunization History  Administered Date(s) Administered   Influenza, High Dose Seasonal PF 04/19/2016   Moderna Sars-Covid-2 Vaccination 05/20/2019, 06/17/2019, 02/07/2020   Pneumococcal Conjugate-13 04/19/2016   Pneumococcal Polysaccharide-23 12/14/2017   Zoster, Live 12/06/2015   We updated and reviewed the patient's past history in detail and it is documented below. Allergies: Patient is allergic to penicillins. Past Medical History Patient  has a past medical history of Age related osteoporosis (11/28/2019), Herniated disc, cervical, Hyperlipidemia, Hypertension, and Thyroid disease. Past Surgical History Patient  has a past surgical history that includes Anterior cervical discectomy; Cesarean section; Tonsillectomy; and Abdominal hysterectomy. Family History: Patient family history includes Alzheimer's disease in her mother; Arthritis in her mother; Asthma in her father; Atrial fibrillation in her mother; COPD in her father; Dementia in her  mother; Diabetes in her brother; Heart disease in her father; Hypertension in her father and mother; Neurodegenerative disease in her brother. Social History:  Patient  reports that she has quit smoking. She has never used smokeless tobacco. She reports current alcohol use. She reports that she does not use drugs.  Review of Systems: Constitutional: negative for fever or malaise Ophthalmic: negative for photophobia, double vision or loss of vision Cardiovascular: negative for chest pain, dyspnea on exertion, or new LE swelling Respiratory: negative for SOB or persistent cough Gastrointestinal: negative for abdominal pain, change in bowel habits or melena Genitourinary: negative for dysuria or gross hematuria, no abnormal uterine bleeding or disharge Musculoskeletal: negative for new gait disturbance or muscular weakness Integumentary: negative for new or persistent rashes, no breast lumps Neurological: negative for TIA or stroke symptoms Psychiatric: negative for SI or delusions Allergic/Immunologic: negative for hives  Patient Care Team    Relationship Specialty Notifications Start End  Willow Ora, MD PCP - General Family Medicine  12/14/17   Annette Stable, OD Consulting Physician Optometry  07/21/19     Objective  Vitals: BP (!) 148/90   Pulse 71   Temp 98.1 F (36.7 C) (Temporal)   Ht 5\' 3"  (1.6 m)   Wt 138 lb 6.4  oz (62.8 kg)   SpO2 98%   BMI 24.52 kg/m  General:  Well developed, well nourished, no acute distress  Psych:  Alert and orientedx3,normal mood and affect HEENT:  Normocephalic, atraumatic, non-icteric sclera,  supple neck without adenopathy, mass or thyromegaly Cardiovascular:  Normal S1, S2, RRR without gallop, rub or murmur Respiratory:  Good breath sounds bilaterally, CTAB with normal respiratory effort Gastrointestinal: normal bowel sounds, soft, non-tender, no noted masses. No HSM MSK: no deformities, contusions. Joints are without erythema or swelling.   Skin:  Warm, no rashes or suspicious lesions noted Neurologic:    Mental status is normal. CN 2-11 are normal. Gross motor and sensory exams are normal. Normal gait. No tremor   Commons side effects, risks, benefits, and alternatives for medications and treatment plan prescribed today were discussed, and the patient expressed understanding of the given instructions. Patient is instructed to call or message via MyChart if he/she has any questions or concerns regarding our treatment plan. No barriers to understanding were identified. We discussed Red Flag symptoms and signs in detail. Patient expressed understanding regarding what to do in case of urgent or emergency type symptoms.  Medication list was reconciled, printed and provided to the patient in AVS. Patient instructions and summary information was reviewed with the patient as documented in the AVS. This note was prepared with assistance of Dragon voice recognition software. Occasional wrong-word or sound-a-like substitutions may have occurred due to the inherent limitations of voice recognition software  This visit occurred during the SARS-CoV-2 public health emergency.  Safety protocols were in place, including screening questions prior to the visit, additional usage of staff PPE, and extensive cleaning of exam room while observing appropriate contact time as indicated for disinfecting solutions.

## 2021-01-02 NOTE — Patient Instructions (Addendum)
Please return in 12 months for your annual complete physical; please come fasting.   We will call you to get your scheduled for your 1st prolia injection.   I will release your lab results to you on your MyChart account with further instructions. Please reply with any questions.   Please take the prescription for Shingrix to the pharmacy so they may administer the vaccinations. Your insurance will then cover the injections.   I have ordered a mammogram and/or bone density for you as we discussed today: '[x]'   Mammogram  '[]'   Bone Density  Please call the office checked below to schedule your appointment:  '[x]'   [facility]      [place], [place]         '[]'   [facility]  [place], [place]   If you have any questions or concerns, please don't hesitate to send me a message via MyChart or call the office at [phone]. Thank you for visiting with [place] today! It's our pleasure caring for you.   Osteoporosis Osteoporosis happens when the bones become thin and less dense than normal. Osteoporosis makes bones more brittle and fragile and more likely to break (fracture). Over time, osteoporosis can cause your bones to become so weak that they fracture after a minor fall. Bones in the hip, wrist, and spine are most likely to fracture due to osteoporosis. What are the causes? The exact cause of this condition is not known. What increases the risk? You are more likely to develop this condition if you: Have family members with this condition. Have poor nutrition. Use the following: Steroid medicines, such as prednisone. Anti-seizure medicines. Nicotine or tobacco, such as cigarettes, e-cigarettes, and chewing tobacco. Are female. Are age 70 or older. Are not physically active (are sedentary). Are of European or Asian descent. Have a small body frame. What are the signs or  symptoms? A fracture might be the first sign of osteoporosis, especially if the fracture results from a fall or injury that usually would not cause a bone to break. Other signs and symptoms include: Pain in the neck or low back. Stooped posture. Loss of height. How is this diagnosed? This condition may be diagnosed based on: Your medical history. A physical exam. A bone mineral density test, also called a DXA or DEXA test (dual-energy X-ray absorptiometry test). This test uses X-rays to measure the amount of minerals in your bones. How is this treated? This condition may be treated by: Making lifestyle changes, such as: Including foods with more calcium and vitamin D in your diet. Doing weight-bearing and muscle-strengthening exercises. Stopping tobacco use. Limiting alcohol intake. Taking medicine to slow the process of bone loss or to increase bone density. Taking daily supplements of calcium and vitamin D. Taking hormone replacement medicines, such as estrogen for women and testosterone for men. Monitoring your levels of calcium and vitamin D. The goal of treatment is to strengthen your bones and lower your risk for a fracture. Follow these instructions at home: Eating and drinking Include calcium and vitamin D in your diet. Calcium is important for bone health, and vitamin D helps your body absorb calcium. Good sources of calcium and vitamin D include: Certain fatty fish, such as salmon and tuna. Products that have calcium and vitamin D added to them (are fortified), such as fortified  cereals. Egg yolks. Cheese. Liver.  Activity Do exercises as told by your health care provider. Ask your health care provider what exercises and activities are safe for you. You should do: Exercises that make you work against gravity (weight-bearing exercises), such as tai chi, yoga, or walking. Exercises to strengthen muscles, such as lifting weights. Lifestyle Do not drink alcohol if: Your  health care provider tells you not to drink. You are pregnant, may be pregnant, or are planning to become pregnant. If you drink alcohol: Limit how much you use to: 0-1 drink a day for women. 0-2 drinks a day for men. Know how much alcohol is in your drink. In the [place] one drink equals one 12 oz bottle of beer (355 mL), one 5 oz glass of wine (148 mL), or one 1 oz glass of hard liquor (44 mL). Do not use any products that contain nicotine or tobacco, such as cigarettes, e-cigarettes, and chewing tobacco. If you need help quitting, ask your health care provider. Preventing falls Use devices to help you move around (mobility aids) as needed, such as canes, walkers, scooters, or crutches. Keep rooms well-lit and clutter-free. Remove tripping hazards from walkways, including cords and throw rugs. Install grab bars in bathrooms and safety rails on stairs. Use rubber mats in the bathroom and other areas that are often wet or slippery. Wear closed-toe shoes that fit well and support your feet. Wear shoes that have rubber soles or low heels. Review your medicines with your health care provider. Some medicines can cause dizziness or changes in blood pressure, which can increase your risk of falling. General instructions Take over-the-counter and prescription medicines only as told by your health care provider. Keep all follow-up visits. This is important. Contact a health care provider if: You have never been screened for osteoporosis and you are: A woman who is age 70 or older. A man who is age 70 or older. or older. Get help right away if: You fall or injure yourself. Summary Osteoporosis is thinning and loss of density in your bones. This makes bones more brittle and fragile and more likely to break (fracture),even with minor falls. The goal of treatment is to strengthen your bones and lower your risk for a fracture. Include calcium and vitamin D in your diet. Calcium is important for bone health, and  vitamin D helps your body absorb calcium. Talk with your health care provider about screening for osteoporosis if you are a woman who is age 70 or older, or a man who is age 70 or older. This information is not intended to replace advice given to you by your health care provider. Make sure you discuss any questions you have with your health care provider. Document Revised: 09/08/2019 Document Reviewed: 09/08/2019 Elsevier Patient Education  [place].

## 2021-01-04 ENCOUNTER — Encounter: Payer: Self-pay | Admitting: Family Medicine

## 2021-01-04 MED ORDER — LEVOTHYROXINE SODIUM 100 MCG PO TABS: ORAL_TABLET | ORAL | 1 refills | Status: DC

## 2021-01-10 ENCOUNTER — Encounter: Payer: Medicare Other | Admitting: Family Medicine

## 2021-01-15 ENCOUNTER — Ambulatory Visit: Payer: Medicare Other

## 2021-01-16 ENCOUNTER — Other Ambulatory Visit: Payer: Self-pay

## 2021-01-16 ENCOUNTER — Ambulatory Visit
Admission: RE | Admit: 2021-01-16 | Discharge: 2021-01-16 | Disposition: A | Discharge status: Home / Self Care | Payer: Medicare Other | Source: Ambulatory Visit | Attending: Family Medicine | Admitting: Family Medicine

## 2021-01-16 DIAGNOSIS — Z1231 Encounter for screening mammogram for malignant neoplasm of breast: Secondary | ICD-10-CM | POA: Diagnosis not present

## 2021-01-29 DIAGNOSIS — Z23 Encounter for immunization: Secondary | ICD-10-CM | POA: Diagnosis not present

## 2021-06-06 ENCOUNTER — Encounter: Payer: Self-pay | Admitting: Family Medicine

## 2021-06-07 MED ORDER — LISINOPRIL 5 MG PO TABS: 5.0000 mg | ORAL_TABLET | Freq: Every day | ORAL | 3 refills | Status: DC

## 2021-06-07 MED ORDER — LEVOTHYROXINE SODIUM 100 MCG PO TABS: ORAL_TABLET | ORAL | 1 refills | Status: DC

## 2021-06-07 NOTE — Telephone Encounter (Signed)
Spoke to pt told her I will send Rx's Lisinopril and Levothyroxine to [facility] order. Pt verbalized understanding. Rx's sent. ?

## 2021-07-29 ENCOUNTER — Other Ambulatory Visit: Payer: Self-pay

## 2021-07-29 ENCOUNTER — Emergency Department (HOSPITAL_COMMUNITY)
Admission: EM | Admit: 2021-07-29 | Discharge: 2021-07-29 | Disposition: A | Discharge status: Home / Self Care | Payer: Medicare Other | Attending: Emergency Medicine | Admitting: Emergency Medicine

## 2021-07-29 ENCOUNTER — Emergency Department (HOSPITAL_COMMUNITY): Payer: Medicare Other

## 2021-07-29 ENCOUNTER — Telehealth: Payer: Self-pay

## 2021-07-29 ENCOUNTER — Telehealth: Payer: Self-pay | Admitting: Family Medicine

## 2021-07-29 ENCOUNTER — Encounter (HOSPITAL_COMMUNITY): Payer: Self-pay

## 2021-07-29 DIAGNOSIS — W010XXA Fall on same level from slipping, tripping and stumbling without subsequent striking against object, initial encounter: Secondary | ICD-10-CM | POA: Insufficient documentation

## 2021-07-29 DIAGNOSIS — Y93K1 Activity, walking an animal: Secondary | ICD-10-CM | POA: Insufficient documentation

## 2021-07-29 DIAGNOSIS — S52572A Other intraarticular fracture of lower end of left radius, initial encounter for closed fracture: Secondary | ICD-10-CM | POA: Insufficient documentation

## 2021-07-29 DIAGNOSIS — M25531 Pain in right wrist: Secondary | ICD-10-CM | POA: Diagnosis not present

## 2021-07-29 DIAGNOSIS — S52502A Unspecified fracture of the lower end of left radius, initial encounter for closed fracture: Secondary | ICD-10-CM | POA: Diagnosis not present

## 2021-07-29 DIAGNOSIS — R202 Paresthesia of skin: Secondary | ICD-10-CM | POA: Diagnosis not present

## 2021-07-29 DIAGNOSIS — S6991XA Unspecified injury of right wrist, hand and finger(s), initial encounter: Secondary | ICD-10-CM | POA: Diagnosis not present

## 2021-07-29 DIAGNOSIS — Y92480 Sidewalk as the place of occurrence of the external cause: Secondary | ICD-10-CM | POA: Diagnosis not present

## 2021-07-29 DIAGNOSIS — W19XXXA Unspecified fall, initial encounter: Secondary | ICD-10-CM

## 2021-07-29 DIAGNOSIS — S6992XA Unspecified injury of left wrist, hand and finger(s), initial encounter: Secondary | ICD-10-CM | POA: Diagnosis present

## 2021-07-29 MED ORDER — OXYCODONE-ACETAMINOPHEN 5-325 MG PO TABS
1.0000 | ORAL_TABLET | Freq: Once | ORAL | Status: AC
  Administered 2021-07-29: 1 via ORAL
  Filled 2021-07-29: qty 1

## 2021-07-29 MED ORDER — OXYCODONE-ACETAMINOPHEN 5-325 MG PO TABS: 1.0000 | ORAL_TABLET | Freq: Four times a day (QID) | ORAL | 0 refills | Status: AC | PRN

## 2021-07-29 NOTE — ED Notes (Signed)
Ortho tech paged for application of splint. 

## 2021-07-29 NOTE — Telephone Encounter (Signed)
Patient Name: Mercedes Briggs [facility] RTIN Gender: Female DOB: 05-Jul-1950 Age: 71 Y 4 M 10 D Return Phone Number: [phone] (Primary), [phone] (Secondary) Address: City/ State/ Zip: [place]  [address] Client [facility] - Administrator, [facility] at [facility] Provider Asencion Partridge- MD Contact Type Call Who Is Calling Patient / Member / Family / Caregiver Call Type Triage / Clinical Relationship To Patient Self Return Phone Number [phone] (Primary) Chief Complaint Hand or Wrist Injury Reason for Call Symptomatic / Request for Health Information Initial Comment Caller states she has fallen and is having wrist and cannot move. Translation No Nurse Assessment Nurse: Annye English, RN, Denise Date/Time (Eastern Time): 07/29/2021 9:56:53 AM Confirm and document reason for call. If symptomatic, describe symptoms. ---Caller states she has fallen this am on sidewalk, and having right wrist pain, but can not move or twist her left arm and has severe pain at the wrist radiating to her elbow. Does the patient have any new or worsening symptoms? ---Yes Will a triage be completed? ---Yes Related visit to physician within the last 2 weeks? ---No Does the PT have any chronic conditions? (i.e. diabetes, asthma, this includes High risk factors for pregnancy, etc.) ---No Is this a behavioral health or substance abuse call? ---No Guidelines Guideline Title Affirmed Question Affirmed Notes Nurse Date/Time (Eastern Time) Hand and Wrist Injury [1] Numbness (loss of sensation) of finger(s) AND [2] present now Quincy, RN, Angelique Blonder 07/29/2021 9:59:25 AM Disp. Time Lamount Cohen Time) Disposition Final User 07/29/2021 10:01:42 AM Go to ED Now Yes Carmon, RN, Leighton Ruff Disagree/Comply Comply Caller Understands Yes PreDisposition Call Doctor Care Advice Given Per Guideline GO TO ED NOW: CARE ADVICE given per Hand and Wrist  Injury (Adult) guideline. Comments User: Greggory Stallion, RN Date/Time Lamount Cohen Time): 07/29/2021 10:02:23 AM Advised to elevate arm > heart and use ice pack keeping towel between skin and ice pack x 20 mins at a time. Referrals [facility] - ED

## 2021-07-29 NOTE — ED Triage Notes (Signed)
Patient was walking her dog and tripped on a raised sidewalk. Patient c/o left and right wrist pain. L>R. ?Patient states she did hit her head, but is not on blood thinners. No LOC. ?

## 2021-07-29 NOTE — Telephone Encounter (Signed)
Patient took a fall while walking the dog - pt is having bad rib pain - patient hit hand face-  ? ?Patient being triaged- awaiting notes -  ?

## 2021-07-29 NOTE — Progress Notes (Signed)
Orthopedic Tech Progress Note ?Patient Details:  ?Mercedes Briggs ?Dec 26, 1950 ?[id number] ? ?Ortho Devices ?Type of Ortho Device: Ace wrap, Sugartong splint ?Ortho Device/Splint Interventions: Application ?  ?Post Interventions ?Patient Tolerated: Well ?Instructions Provided: Care of device ? ?Saul Fordyce ?07/29/2021, 2:35 PM ? ?

## 2021-07-29 NOTE — ED Notes (Signed)
I provided reinforced discharge education based off of discharge instructions. Pt acknowledged and understood my education. Pt had no further questions/concerns for provider/myself.  °

## 2021-07-29 NOTE — Discharge Instructions (Addendum)
Please schedule an appointment with Dr. Ophelia Charter with [facility] for tomorrow afternoon or Wednesday.  You should call when you get out of the emergency department to set up this appointment. Keep splint in place. Keep dry. Continue to elevate left wrist. ? ?You can take tylenol or ibuprofen for pain relief. I have prescribed you Percocet for breakthrough pain. Use this as needed. Do not take more than prescribed. Do not take prior to driving as it may make you drowsy. If you develop shallow respirations or altered mental status, please return to the ED. ? ? ?

## 2021-07-29 NOTE — ED Provider Notes (Signed)
?[facility]-EMERGENCY DEPT ?Provider Note ? ? ?CSN: [id number] ?Arrival date & time: 07/29/21  1018 ? ?  ? ?History ?PMH: HTN, HLD, Thyroid disease, osteoporosis ?Chief Complaint  ?Patient presents with  ? Fall  ? ? ?Mercedes Briggs is a 71 y.o. female.  Patient presents emergency department after a fall.  She was walking her dog and tripped on a raised sidewalk when she fell forward and braced herself on bilateral wrist.  She says that she has bilateral wrist pain, but left wrist she has barely been able to move.  She has tingling in her left fingers.  Denies numbness. ? ? ?Fall ? ? ?  ? ?Home Medications ?Prior to Admission medications   ?Medication Sig Start Date End Date Taking? Authorizing Provider  ?oxyCODONE-acetaminophen (PERCOCET/ROXICET) 5-325 MG tablet Take 1 tablet by mouth every 6 (six) hours as needed for up to 5 days for severe pain. 07/29/21 08/03/21 Yes Mare Ludtke, Finis Bud, PA-C  ?Ascorbic Acid (VITAMIN C) 1000 MG tablet Take 1,000 mg by mouth daily.    [provider]  ?b complex vitamins capsule Take 1 capsule by mouth daily.    [provider]  ?CALCIUM CITRATE PO Take by mouth. ?Patient not taking: Reported on 01/02/2021    [provider]  ?Coenzyme Q-10 100 MG capsule Take 100 mg by mouth daily.    [provider]  ?levothyroxine (SYNTHROID) 100 MCG tablet TAKE 1 TABLET BY MOUTH EVERY MORNING BEFORE BREAKFAST 06/07/21   Willow Ora, MD  ?lisinopril (ZESTRIL) 5 MG tablet Take 1 tablet (5 mg total) by mouth daily. 06/07/21   Willow Ora, MD  ?Magnesium 125 MG CAPS Take by mouth.    [provider]  ?Omega-3 Fatty Acids (OMEGA-3 2100 PO) Take by mouth.    [provider]  ?VITAMIN D PO Take by mouth.    [provider]  ?   ? ?Allergies    ?Penicillins   ? ?Review of Systems   ?Review of Systems  ?Musculoskeletal:  Positive for arthralgias and joint swelling.  ?All other systems reviewed and are  negative. ? ?Physical Exam ?Updated Vital Signs ?BP (!) 193/77 (BP Location: Right Arm)   Pulse 75   Temp 97.8 ?F (36.6 ?C) (Oral)   Resp 17   Ht 5\' 4"  (1.626 m)   Wt 61.7 kg   SpO2 100%   BMI 23.34 kg/m?  ?Physical Exam ?Vitals and nursing note reviewed.  ?Constitutional:   ?   General: She is not in acute distress. ?   Appearance: Normal appearance. She is well-developed. She is not ill-appearing, toxic-appearing or diaphoretic.  ?HENT:  ?   Head: Normocephalic and atraumatic.  ?   Comments: No signs of head trauma ?   Nose: No nasal deformity.  ?   Mouth/Throat:  ?   Lips: Pink. No lesions.  ?Eyes:  ?   General: Gaze aligned appropriately. No scleral icterus.    ?   Right eye: No discharge.     ?   Left eye: No discharge.  ?   Conjunctiva/sclera: Conjunctivae normal.  ?   Right eye: Right conjunctiva is not injected. No exudate or hemorrhage. ?   Left eye: Left conjunctiva is not injected. No exudate or hemorrhage. ?Pulmonary:  ?   Effort: Pulmonary effort is normal. No respiratory distress.  ?Musculoskeletal:  ?   Comments: Left wrist with swelling present.  No obvious deformity.  Radial pulse 2+.  Difficulty moving fingers  and ranging wrist.  Sensation intact.  Capillary refill intact. ? ?Right wrist is without swelling or deformity.  Radial pulse 2+.  No issues moving all fingers and range of motion of the wrist is normal.  ?Skin: ?   General: Skin is warm and dry.  ?Neurological:  ?   Mental Status: She is alert and oriented to person, place, and time.  ?   Comments: Alert and Oriented x 3 ?Speech clear with no aphasia ?Cranial Nerve testing ?- PERRLA. EOM intact. No Nystagmus ?- Facial Sensation grossly intact ?- No facial asymmetry ?Motor: ?- 5/5 motor strength in all four extremities.  ?Sensation: ?- Grossly intact in all four extremities.  ?Coordination:  ?- Gait without abnormality. ?  ?Psychiatric:     ?   Mood and Affect: Mood normal.     ?   Speech: Speech normal.     ?   Behavior: Behavior  normal. Behavior is cooperative.  ? ? ?ED Results / Procedures / Treatments   ?Labs ?(all labs ordered are listed, but only abnormal results are displayed) ?Labs Reviewed - No data to display ? ?EKG ?None ? ?Radiology ?DG Wrist Complete Left ? ?Result Date: 07/29/2021 ?CLINICAL DATA:  Fall, left worse than right wrist pain EXAM: LEFT WRIST - COMPLETE 3+ VIEW COMPARISON:  None. FINDINGS: There is an impacted mildly displaced fracture of the distal radius with probable intra-articular extension. There is approximately 3 mm anterior displacement and mild anterior angulation of the dominant distal fragment. Radiocarpal alignment is maintained. No ulnar styloid fracture is seen. There is mild overlying soft tissue swelling. IMPRESSION: Mildly displaced and angulated impacted fracture of the distal radius with likely intra-articular extension. Electronically Signed   By: Lesia Hausen M.D.   On: 07/29/2021 12:03  ? ?DG Wrist Complete Right ? ?Result Date: 07/29/2021 ?CLINICAL DATA:  Fall, injury EXAM: RIGHT WRIST - COMPLETE 3+ VIEW COMPARISON:  None. FINDINGS: There is no evidence of fracture or dislocation. There is no evidence of arthropathy or other focal bone abnormality. Soft tissues are unremarkable. IMPRESSION: No fracture or dislocation of the right wrist. The carpus is normally aligned. Electronically Signed   By: Jearld Lesch M.D.   On: 07/29/2021 12:03   ? ?Procedures ?Procedures  ? ? ?Medications Ordered in ED ?Medications  ?oxyCODONE-acetaminophen (PERCOCET/ROXICET) 5-325 MG per tablet 1 tablet (1 tablet Oral Given 07/29/21 1351)  ? ? ?ED Course/ Medical Decision Making/ A&P ?Clinical Course as of 07/29/21 1456  ?Mon Jul 29, 2021  ?1408 Dr. Ophelia Charter on phone. Sugar tong splint today. Tomorrow or Wednesday follow up.  [GL]  ?  ?Clinical Course User Index ?[GL] Saysha Menta, Finis Bud, PA-C  ? ?                        ?Medical Decision Making ?Amount and/or Complexity of Data Reviewed ?Radiology:  ordered. ? ?Risk ?Prescription drug management. ? ? ?Female presents after a fall with a FOOSH injury to bilateral wrist.  She has obvious swelling to left wrist with decreased range of motion.  She has associated radial paresthesias.  Otherwise neurovascularly intact. ? ?Left wrist:  ?Mildly displaced and angulated impacted fracture of the distal  ?radius with likely intra-articular extension.  ?   ? ?Right wrist without abnormality.  ? ?With displaced and angulated impacted fracture and articular extension, placed consult called orthopedic surgery for recommendations. ? ?@1408 , discussed case with Dr. .  He recommends sugar-tong splint and follow-up in office  this week. ? ?This was a supervised visit with my attending physician, Dr. Gwenette GreetS Gray who I have discussed this case with. He has made changes and recommendations as needed.  ? ?Final Clinical Impression(s) / ED Diagnoses ?Final diagnoses:  ?Fall, initial encounter  ?Other closed intra-articular fracture of distal end of left radius, initial encounter  ? ? ?Rx / DC Orders ?ED Discharge Orders   ? ?      Ordered  ?  oxyCODONE-acetaminophen (PERCOCET/ROXICET) 5-325 MG tablet  Every 6 hours PRN       ? 07/29/21 1413  ? ?  ?  ? ?  ? ? ?  ?Claudie LeachLoeffler, Azariah Latendresse C, PA-C ?07/29/21 1456 ? ?  ?Sloan LeiterGray, Samuel A, DO ?07/29/21 1611 ? ?

## 2021-07-30 ENCOUNTER — Encounter: Payer: Self-pay | Admitting: Orthopedic Surgery

## 2021-07-30 ENCOUNTER — Ambulatory Visit (INDEPENDENT_AMBULATORY_CARE_PROVIDER_SITE_OTHER): Payer: Medicare Other | Admitting: Orthopedic Surgery

## 2021-07-30 VITALS — BP 154/78 | HR 65 | Ht 64.0 in | Wt 136.0 lb

## 2021-07-30 DIAGNOSIS — S52502A Unspecified fracture of the lower end of left radius, initial encounter for closed fracture: Secondary | ICD-10-CM | POA: Insufficient documentation

## 2021-07-30 DIAGNOSIS — S52562A Barton's fracture of left radius, initial encounter for closed fracture: Secondary | ICD-10-CM | POA: Diagnosis not present

## 2021-07-30 NOTE — H&P (View-Only) (Signed)
? ?Office Visit Note ?  ?Patient: Mercedes Briggs           ?Date of Birth: April 07, 1951           ?MRN: [id number] ?Visit Date: 07/30/2021 ?             ?Requested by: Leamon Arnt, MD ?[address] ?[place],  [place] [address] ?PCP: Leamon Arnt, MD ? ? ?Assessment & Plan: ?Visit Diagnoses:  ?1. Closed Barton's fracture of left radius, initial encounter   ? ? ?Plan: Patient has what appears to be a volar shear fracture of the left distal radius.  We discussed the nature of the fracture and both conservative and surgical treatment options.  Based on the fracture orientation, I am concerned that the fracture is too unstable for cast immobilization.  We will plan on treating this surgically with open reduction and internal fixation.  We discussed the risk of surgery including bleeding, infection, damage to neurovascular structures, nonunion, malunion, persistent wrist pain, need for additional surgery.  After discussion, the patient is agreeable and wishes to proceed with surgery.  Awaiting a CT scan this week to better evaluate the fracture orientation.  We will tentatively plan for surgery on Monday. ? ?Follow-Up Instructions: No follow-ups on file.  ? ?Orders:  ?Orders Placed This Encounter  ?Procedures  ? CT WRIST LEFT WO CONTRAST  ? ?No orders of the defined types were placed in this encounter. ? ? ? ? Procedures: ?No procedures performed ? ? ?Clinical Data: ?No additional findings. ? ? ?Subjective: ?Chief Complaint  ?Patient presents with  ? Left Wrist - Fracture, Injury  ?  DOI:07/29/21, RIGHT Handed, Pain:6 /10, +tingling in fingers, swelling. States that she stripped on the side and fell.   ? ? ?This is a 71 year old right-hand-dominant female who presents for ER follow-up of a left distal radius fracture.  She tripped over the sidewalk and fell on her outstretched arms yesterday.  She was seen in the ER where x-rays were obtained and she was placed in a sugar-tong splint.  She notes that she  has some mild swelling of her fingers but the splint is otherwise comfortable.  She denies any numbness or paresthesias in her fingers.  She denies pain elsewhere in her extremity. ? ?Injury ? ? ?Review of Systems ? ? ?Objective: ?Vital Signs: BP (!) 154/78 (BP Location: Left Arm, Patient Position: Sitting)   Pulse 65   Ht 5\' 4"  (1.626 m)   Wt 136 lb 0.4 oz (61.7 kg)   BMI 23.35 kg/m?  ? ?Physical Exam ?Constitutional:   ?   Appearance: Normal appearance.  ?Cardiovascular:  ?   Rate and Rhythm: Normal rate.  ?   Pulses: Normal pulses.  ?Pulmonary:  ?   Effort: Pulmonary effort is normal.  ?Skin: ?   General: Skin is warm and dry.  ?   Capillary Refill: Capillary refill takes less than 2 seconds.  ?Neurological:  ?   Mental Status: She is alert.  ? ? ?Left Hand Exam  ? ?Other  ?Erythema: absent ?Sensation: normal ?Pulse: present ? ?Comments:  Sugartong splint clean and dry.  Able to flex/extend all fingers within limits of splint.  SILT throughout exposed fingers.  ? ? ? ? ?Specialty Comments:  ?No specialty comments available. ? ?Imaging: ?No results found. ? ? ?PMFS History: ?Patient Active Problem List  ? Diagnosis Date Noted  ? 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 Refusal of statin medication by patient 09/26/2019  ?  Acquired hypothyroidism 12/06/2015  ? White coat syndrome with diagnosis of hypertension 12/06/2015  ? 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 Mixed hyperlipidemia 12/06/2015  ? ?Past Medical History:  ?Diagnosis Date  ? 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 Dexa 11/2019: T = - 2.5 lumbar spine. Offer treatment.   ? Herniated disc, cervical   ? Hyperlipidemia   ? Hypertension   ? Thyroid disease   ?  ?Family History  ?Problem Relation Age of Onset  ? Alzheimer's disease Mother   ? Arthritis Mother   ? Atrial fibrillation Mother   ? Dementia Mother   ? Hypertension Mother   ? Asthma Father   ? COPD Father   ? Heart disease Father   ? Hypertension Father   ? Diabetes Brother   ?  Neurodegenerative disease Brother   ?     storage cell disease  ?  ?Past Surgical History:  ?Procedure Laterality Date  ? ABDOMINAL HYSTERECTOMY    ? ANTERIOR CERVICAL DISCECTOMY    ? CESAREAN SECTION    ? TONSILLECTOMY    ? ?Social History  ? ?Occupational History  ? 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 Smoking status: Never  ? Smokeless tobacco: Never  ? Tobacco comments:  ?  occasionally   ?Vaping Use  ? Vaping Use: Never used  ?Substance and Sexual Activity  ? Alcohol use: Yes  ?  Comment: socially  ? Drug use: Never  ? Sexual activity: Yes  ?  Birth control/protection: Post-menopausal  ? ? ? ? ? ? ?

## 2021-07-30 NOTE — Progress Notes (Signed)
? ?Office Visit Note ?  ?Patient: Mercedes Briggs           ?Date of Birth: 06/20/1950           ?MRN: [id number] ?Visit Date: 07/30/2021 ?             ?Requested by: Andy, Camille L, MD ?[address] ?[place],  [place] [address] ?PCP: Andy, Camille L, MD ? ? ?Assessment & Plan: ?Visit Diagnoses:  ?1. Closed Barton's fracture of left radius, initial encounter   ? ? ?Plan: Patient has what appears to be a volar shear fracture of the left distal radius.  We discussed the nature of the fracture and both conservative and surgical treatment options.  Based on the fracture orientation, I am concerned that the fracture is too unstable for cast immobilization.  We will plan on treating this surgically with open reduction and internal fixation.  We discussed the risk of surgery including bleeding, infection, damage to neurovascular structures, nonunion, malunion, persistent wrist pain, need for additional surgery.  After discussion, the patient is agreeable and wishes to proceed with surgery.  Awaiting a CT scan this week to better evaluate the fracture orientation.  We will tentatively plan for surgery on Monday. ? ?Follow-Up Instructions: No follow-ups on file.  ? ?Orders:  ?Orders Placed This Encounter  ?Procedures  ? CT WRIST LEFT WO CONTRAST  ? ?No orders of the defined types were placed in this encounter. ? ? ? ? Procedures: ?No procedures performed ? ? ?Clinical Data: ?No additional findings. ? ? ?Subjective: ?Chief Complaint  ?Patient presents with  ? Left Wrist - Fracture, Injury  ?  DOI:07/29/21, RIGHT Handed, Pain:6 /10, +tingling in fingers, swelling. States that she stripped on the side and fell.   ? ? ?This is a 71-year-old right-hand-dominant female who presents for ER follow-up of a left distal radius fracture.  She tripped over the sidewalk and fell on her outstretched arms yesterday.  She was seen in the ER where x-rays were obtained and she was placed in a sugar-tong splint.  She notes that she  has some mild swelling of her fingers but the splint is otherwise comfortable.  She denies any numbness or paresthesias in her fingers.  She denies pain elsewhere in her extremity. ? ?Injury ? ? ?Review of Systems ? ? ?Objective: ?Vital Signs: BP (!) 154/78 (BP Location: Left Arm, Patient Position: Sitting)   Pulse 65   Ht 5' 4" (1.626 m)   Wt 136 lb 0.4 oz (61.7 kg)   BMI 23.35 kg/m?  ? ?Physical Exam ?Constitutional:   ?   Appearance: Normal appearance.  ?Cardiovascular:  ?   Rate and Rhythm: Normal rate.  ?   Pulses: Normal pulses.  ?Pulmonary:  ?   Effort: Pulmonary effort is normal.  ?Skin: ?   General: Skin is warm and dry.  ?   Capillary Refill: Capillary refill takes less than 2 seconds.  ?Neurological:  ?   Mental Status: She is alert.  ? ? ?Left Hand Exam  ? ?Other  ?Erythema: absent ?Sensation: normal ?Pulse: present ? ?Comments:  Sugartong splint clean and dry.  Able to flex/extend all fingers within limits of splint.  SILT throughout exposed fingers.  ? ? ? ? ?Specialty Comments:  ?No specialty comments available. ? ?Imaging: ?No results found. ? ? ?PMFS History: ?Patient Active Problem List  ? Diagnosis Date Noted  ? 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 Refusal of statin medication by patient 09/26/2019  ?   Acquired hypothyroidism 12/06/2015  ? White coat syndrome with diagnosis of hypertension 12/06/2015  ? 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 Mixed hyperlipidemia 12/06/2015  ? ?Past Medical History:  ?Diagnosis Date  ? 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 Dexa 11/2019: T = - 2.5 lumbar spine. Offer treatment.   ? Herniated disc, cervical   ? Hyperlipidemia   ? Hypertension   ? Thyroid disease   ?  ?Family History  ?Problem Relation Age of Onset  ? Alzheimer's disease Mother   ? Arthritis Mother   ? Atrial fibrillation Mother   ? Dementia Mother   ? Hypertension Mother   ? Asthma Father   ? COPD Father   ? Heart disease Father   ? Hypertension Father   ? Diabetes Brother   ?  Neurodegenerative disease Brother   ?     storage cell disease  ?  ?Past Surgical History:  ?Procedure Laterality Date  ? ABDOMINAL HYSTERECTOMY    ? ANTERIOR CERVICAL DISCECTOMY    ? CESAREAN SECTION    ? TONSILLECTOMY    ? ?Social History  ? ?Occupational History  ? 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 Smoking status: Never  ? Smokeless tobacco: Never  ? Tobacco comments:  ?  occasionally   ?Vaping Use  ? Vaping Use: Never used  ?Substance and Sexual Activity  ? Alcohol use: Yes  ?  Comment: socially  ? Drug use: Never  ? Sexual activity: Yes  ?  Birth control/protection: Post-menopausal  ? ? ? ? ? ? ?

## 2021-07-31 ENCOUNTER — Ambulatory Visit
Admission: RE | Admit: 2021-07-31 | Discharge: 2021-07-31 | Disposition: A | Discharge status: Home / Self Care | Payer: Medicare Other | Source: Ambulatory Visit | Attending: Orthopedic Surgery | Admitting: Orthopedic Surgery

## 2021-07-31 DIAGNOSIS — M189 Osteoarthritis of first carpometacarpal joint, unspecified: Secondary | ICD-10-CM | POA: Diagnosis not present

## 2021-07-31 DIAGNOSIS — S59292A Other physeal fracture of lower end of radius, left arm, initial encounter for closed fracture: Secondary | ICD-10-CM | POA: Diagnosis not present

## 2021-07-31 DIAGNOSIS — S52562A Barton's fracture of left radius, initial encounter for closed fracture: Secondary | ICD-10-CM

## 2021-08-02 ENCOUNTER — Encounter (HOSPITAL_COMMUNITY): Payer: Self-pay | Admitting: Orthopedic Surgery

## 2021-08-02 ENCOUNTER — Other Ambulatory Visit: Payer: Self-pay | Admitting: Orthopedic Surgery

## 2021-08-02 NOTE — Progress Notes (Signed)
TWO VISITORS ARE ALLOWED TO COME WITH YOU AND STAY IN THE SURGICAL WAITING ROOM ONLY DURING PRE OP AND PROCEDURE DAY OF SURGERY.  ? ?PCP - Dr Creed Copper ?Cardiologist - n/a ? ?Chest x-ray - n/a ?EKG - DOS ?Stress Test - n/a ?ECHO - n/a ?Cardiac Cath - n/a ? ?ICD Pacemaker/Loop - n/a ? ?Sleep Study -  n/a ?CPAP - none ? ?ERAS: Clear liquids til 10:30 AM DOS ? ?STOP now taking any Aspirin (unless otherwise instructed by your surgeon), Aleve, Naproxen, Ibuprofen, Motrin, Advil, Goody's, BC's, all herbal medications, fish oil, and all vitamins.  ? ?Coronavirus Screening ?Do you have any of the following symptoms:  ?Cough yes/no: No ?Fever (>100.14F)  yes/no: No ?Runny nose yes/no: No ?Sore throat yes/no: No ?Difficulty breathing/shortness of breath  yes/no: No ? ?Have you traveled in the last 14 days and where? yes/no: No ? ?Patient verbalized understanding of instructions that were given via phone. ?

## 2021-08-05 ENCOUNTER — Ambulatory Visit (HOSPITAL_BASED_OUTPATIENT_CLINIC_OR_DEPARTMENT_OTHER): Payer: Medicare Other | Admitting: Certified Registered Nurse Anesthetist

## 2021-08-05 ENCOUNTER — Other Ambulatory Visit: Payer: Self-pay

## 2021-08-05 ENCOUNTER — Ambulatory Visit (HOSPITAL_COMMUNITY)
Admission: RE | Admit: 2021-08-05 | Discharge: 2021-08-05 | Disposition: A | Discharge status: Home / Self Care | Payer: Medicare Other | Attending: Orthopedic Surgery | Admitting: Orthopedic Surgery

## 2021-08-05 ENCOUNTER — Encounter (HOSPITAL_COMMUNITY)
Admission: RE | Disposition: A | Discharge status: Home / Self Care | Payer: Self-pay | Source: Home / Self Care | Attending: Orthopedic Surgery

## 2021-08-05 ENCOUNTER — Ambulatory Visit (HOSPITAL_COMMUNITY): Payer: Medicare Other

## 2021-08-05 ENCOUNTER — Ambulatory Visit (HOSPITAL_COMMUNITY): Payer: Medicare Other | Admitting: Certified Registered Nurse Anesthetist

## 2021-08-05 DIAGNOSIS — S52572A Other intraarticular fracture of lower end of left radius, initial encounter for closed fracture: Secondary | ICD-10-CM | POA: Diagnosis not present

## 2021-08-05 DIAGNOSIS — I10 Essential (primary) hypertension: Secondary | ICD-10-CM | POA: Diagnosis not present

## 2021-08-05 DIAGNOSIS — S52502A Unspecified fracture of the lower end of left radius, initial encounter for closed fracture: Secondary | ICD-10-CM | POA: Diagnosis not present

## 2021-08-05 DIAGNOSIS — E039 Hypothyroidism, unspecified: Secondary | ICD-10-CM | POA: Insufficient documentation

## 2021-08-05 DIAGNOSIS — W010XXA Fall on same level from slipping, tripping and stumbling without subsequent striking against object, initial encounter: Secondary | ICD-10-CM | POA: Insufficient documentation

## 2021-08-05 DIAGNOSIS — S52562A Barton's fracture of left radius, initial encounter for closed fracture: Secondary | ICD-10-CM | POA: Diagnosis not present

## 2021-08-05 DIAGNOSIS — M810 Age-related osteoporosis without current pathological fracture: Secondary | ICD-10-CM | POA: Diagnosis not present

## 2021-08-05 HISTORY — DX: Hypothyroidism, unspecified: E03.9

## 2021-08-05 HISTORY — PX: OPEN REDUCTION INTERNAL FIXATION (ORIF) DISTAL RADIAL FRACTURE: SHX5989

## 2021-08-05 LAB — BASIC METABOLIC PANEL
Anion gap: 7 (ref 5–15)
BUN: 7 mg/dL — ABNORMAL LOW (ref 8–23)
CO2: 25 mmol/L (ref 22–32)
Calcium: 9.4 mg/dL (ref 8.9–10.3)
Chloride: 106 mmol/L (ref 98–111)
Creatinine, Ser: 0.77 mg/dL (ref 0.44–1.00)
GFR, Estimated: >60 mL/min (ref >60)
Glucose, Bld: 96 mg/dL (ref 70–99)
Potassium: 4 mmol/L (ref 3.5–5.1)
Sodium: 138 mmol/L (ref 135–145)

## 2021-08-05 LAB — CBC
HCT: 42.1 % (ref 36.0–46.0)
Hemoglobin: 14.9 g/dL (ref 12.0–15.0)
MCH: 32.7 pg (ref 26.0–34.0)
MCHC: 35.4 g/dL (ref 30.0–36.0)
MCV: 92.3 fL (ref 80.0–100.0)
Platelets: 236 10*3/uL (ref 150–400)
RBC: 4.56 MIL/uL (ref 3.87–5.11)
RDW: 11.5 % (ref 11.5–15.5)
WBC: 6.9 10*3/uL (ref 4.0–10.5)
nRBC: 0 % (ref 0.0–0.2)

## 2021-08-05 SURGERY — OPEN REDUCTION INTERNAL FIXATION (ORIF) DISTAL RADIUS FRACTURE
Anesthesia: Monitor Anesthesia Care | Site: Wrist | Laterality: Left

## 2021-08-05 MED ORDER — CLONIDINE HCL (ANALGESIA) 100 MCG/ML EP SOLN
EPIDURAL | Status: DC | PRN
  Administered 2021-08-05: 100 ug

## 2021-08-05 MED ORDER — MIDAZOLAM HCL 2 MG/2ML IJ SOLN
INTRAMUSCULAR | Status: AC
  Administered 2021-08-05: 1 mg via INTRAVENOUS
  Filled 2021-08-05: qty 2

## 2021-08-05 MED ORDER — 0.9 % SODIUM CHLORIDE (POUR BTL) OPTIME
TOPICAL | Status: DC | PRN
  Administered 2021-08-05: 1000 mL

## 2021-08-05 MED ORDER — CEFAZOLIN SODIUM-DEXTROSE 2-4 GM/100ML-% IV SOLN
2.0000 g | INTRAVENOUS | Status: AC
  Administered 2021-08-05: 2 g via INTRAVENOUS

## 2021-08-05 MED ORDER — ORAL CARE MOUTH RINSE: 15.0000 mL | Freq: Once | OROMUCOSAL | Status: AC

## 2021-08-05 MED ORDER — BUPIVACAINE-EPINEPHRINE (PF) 0.5% -1:200000 IJ SOLN
INTRAMUSCULAR | Status: DC | PRN
  Administered 2021-08-05: 30 mL via PERINEURAL

## 2021-08-05 MED ORDER — ACETAMINOPHEN 500 MG PO TABS
1000.0000 mg | ORAL_TABLET | Freq: Once | ORAL | Status: AC
  Administered 2021-08-05: 1000 mg via ORAL
  Filled 2021-08-05: qty 2

## 2021-08-05 MED ORDER — LACTATED RINGERS IV SOLN: INTRAVENOUS | Status: DC

## 2021-08-05 MED ORDER — PROPOFOL 500 MG/50ML IV EMUL
INTRAVENOUS | Status: DC | PRN
  Administered 2021-08-05: 75 ug/kg/min via INTRAVENOUS

## 2021-08-05 MED ORDER — PHENYLEPHRINE HCL-NACL 20-0.9 MG/250ML-% IV SOLN
INTRAVENOUS | Status: DC | PRN
  Administered 2021-08-05: 20 ug/min via INTRAVENOUS

## 2021-08-05 MED ORDER — FENTANYL CITRATE (PF) 100 MCG/2ML IJ SOLN
100.0000 ug | Freq: Once | INTRAMUSCULAR | Status: AC
  Filled 2021-08-05: qty 2

## 2021-08-05 MED ORDER — OXYCODONE HCL 5 MG PO TABS: 5.0000 mg | ORAL_TABLET | ORAL | 0 refills | Status: AC | PRN

## 2021-08-05 MED ORDER — CHLORHEXIDINE GLUCONATE 0.12 % MT SOLN
15.0000 mL | Freq: Once | OROMUCOSAL | Status: AC
  Administered 2021-08-05: 15 mL via OROMUCOSAL
  Filled 2021-08-05: qty 15

## 2021-08-05 MED ORDER — CEFAZOLIN SODIUM-DEXTROSE 2-4 GM/100ML-% IV SOLN
INTRAVENOUS | Status: AC
  Filled 2021-08-05: qty 100

## 2021-08-05 MED ORDER — FENTANYL CITRATE (PF) 100 MCG/2ML IJ SOLN
INTRAMUSCULAR | Status: AC
  Administered 2021-08-05: 100 ug via INTRAVENOUS
  Filled 2021-08-05: qty 2

## 2021-08-05 MED ORDER — MIDAZOLAM HCL 2 MG/2ML IJ SOLN
1.0000 mg | Freq: Once | INTRAMUSCULAR | Status: AC
  Filled 2021-08-05: qty 1

## 2021-08-05 SURGICAL SUPPLY — 72 items
BAG COUNTER SPONGE SURGICOUNT (BAG) ×2 IMPLANT
BIT DRILL SOLID 2.0X40MM (BIT) IMPLANT
BIT DRILL SOLID 2.5X40MM (BIT) IMPLANT
BLADE CLIPPER SURG (BLADE) IMPLANT
BNDG ELASTIC 3X5.8 VLCR STR LF (GAUZE/BANDAGES/DRESSINGS) ×2 IMPLANT
BNDG ELASTIC 4X5.8 VLCR STR LF (GAUZE/BANDAGES/DRESSINGS) ×2 IMPLANT
BNDG ESMARK 4X9 LF (GAUZE/BANDAGES/DRESSINGS) ×2 IMPLANT
BNDG GAUZE ELAST 4 BULKY (GAUZE/BANDAGES/DRESSINGS) ×2 IMPLANT
CANISTER SUCT 3000ML PPV (MISCELLANEOUS) ×2 IMPLANT
CORD BIPOLAR FORCEPS 12FT (ELECTRODE) ×2 IMPLANT
COVER SURGICAL LIGHT HANDLE (MISCELLANEOUS) ×2 IMPLANT
CUFF TOURN SGL QUICK 18X4 (TOURNIQUET CUFF) ×2 IMPLANT
CUFF TOURN SGL QUICK 24 (TOURNIQUET CUFF)
CUFF TRNQT CYL 24X4X16.5-23 (TOURNIQUET CUFF) IMPLANT
DECANTER SPIKE VIAL GLASS SM (MISCELLANEOUS) ×2 IMPLANT
DRAPE OEC MINIVIEW 54X84 (DRAPES) ×2 IMPLANT
DRAPE SURG 17X11 SM STRL (DRAPES) ×2 IMPLANT
DRILL SOLID 2.0X40MM (BIT) ×2
DRILL SOLID 2.5X40MM (BIT) ×2
DRSG ADAPTIC 3X8 NADH LF (GAUZE/BANDAGES/DRESSINGS) ×2 IMPLANT
GAUZE 4X4 16PLY ~~LOC~~+RFID DBL (SPONGE) ×2 IMPLANT
GAUZE SPONGE 4X4 12PLY STRL (GAUZE/BANDAGES/DRESSINGS) ×2 IMPLANT
GAUZE XEROFORM 1X8 LF (GAUZE/BANDAGES/DRESSINGS) ×1 IMPLANT
GAUZE XEROFORM 5X9 LF (GAUZE/BANDAGES/DRESSINGS) ×2 IMPLANT
GLOVE BIOGEL PI IND STRL 8.5 (GLOVE) ×1 IMPLANT
GLOVE BIOGEL PI INDICATOR 8.5 (GLOVE) ×1
GLOVE SURG ORTHO 8.0 STRL STRW (GLOVE) ×2 IMPLANT
GOWN STRL REUS W/ TWL LRG LVL3 (GOWN DISPOSABLE) ×1 IMPLANT
GOWN STRL REUS W/ TWL XL LVL3 (GOWN DISPOSABLE) ×1 IMPLANT
GOWN STRL REUS W/TWL LRG LVL3 (GOWN DISPOSABLE) ×1
GOWN STRL REUS W/TWL XL LVL3 (GOWN DISPOSABLE) ×1
GUIDE AIMING 1.5MM (WIRE) ×2 IMPLANT
KIT BASIN OR (CUSTOM PROCEDURE TRAY) ×2 IMPLANT
KIT TURNOVER KIT B (KITS) ×2 IMPLANT
NDL HYPO 25X1 1.5 SAFETY (NEEDLE) ×1 IMPLANT
NEEDLE HYPO 25X1 1.5 SAFETY (NEEDLE) ×2 IMPLANT
NS IRRIG 1000ML POUR BTL (IV SOLUTION) ×2 IMPLANT
PACK ORTHO EXTREMITY (CUSTOM PROCEDURE TRAY) ×2 IMPLANT
PAD ARMBOARD 7.5X6 YLW CONV (MISCELLANEOUS) ×4 IMPLANT
PAD CAST 3X4 CTTN HI CHSV (CAST SUPPLIES) IMPLANT
PAD CAST 4YDX4 CTTN HI CHSV (CAST SUPPLIES) ×1 IMPLANT
PADDING CAST COTTON 3X4 STRL (CAST SUPPLIES) ×1
PADDING CAST COTTON 4X4 STRL (CAST SUPPLIES) ×1
PEG GEMINUS LOCK HCLP 2.7X19 (Peg) ×1 IMPLANT
PEG GEMINUS SMOOTH LOCK 2.0X19 (Peg) ×3 IMPLANT
PEG GEMINUS THRD TPNL 2.7X20 (Peg) ×1 IMPLANT
PEG GEMINUS THRD TPNL 2.7X22 (Peg) ×1 IMPLANT
PEG NON LOCK THREAD 2.7X24MM (Screw) ×1 IMPLANT
PEG SMOOTH LOCK 2.0X18 (Screw) ×3 IMPLANT
PEG SMOOTH LOCKING 20MM (Peg) ×1 IMPLANT
PLATE STD 3H LEFT (Plate) ×1 IMPLANT
SCREW CORT LOCK 3.5X12 TI (Screw) ×1 IMPLANT
SCREW GEMINUS CORT LOCK 3.5X11 (Screw) ×1 IMPLANT
SCREW NONLOCK POLYAXIAL 3.5X11 (Screw) ×1 IMPLANT
SCREWDRIVER SURG ST 2 (INSTRUMENTS) ×2 IMPLANT
SOAP 2 % CHG 4 OZ (WOUND CARE) ×2 IMPLANT
SPLINT FIBERGLASS 4X30 (CAST SUPPLIES) ×1 IMPLANT
SPONGE T-LAP 4X18 ~~LOC~~+RFID (SPONGE) ×2 IMPLANT
SUT ETHILON 4 0 PS 2 18 (SUTURE) ×2 IMPLANT
SUT MNCRL AB 3-0 PS2 18 (SUTURE) ×2 IMPLANT
SUT PROLENE 4 0 PS 2 18 (SUTURE) IMPLANT
SUT VIC AB 2-0 CT1 27 (SUTURE)
SUT VIC AB 2-0 CT1 TAPERPNT 27 (SUTURE) IMPLANT
SUT VICRYL 4-0 PS2 18IN ABS (SUTURE) IMPLANT
SYR CONTROL 10ML LL (SYRINGE) IMPLANT
TOWEL GREEN STERILE (TOWEL DISPOSABLE) ×2 IMPLANT
TOWEL GREEN STERILE FF (TOWEL DISPOSABLE) IMPLANT
TUBE CONNECTING 12X1/4 (SUCTIONS) ×2 IMPLANT
WATER STERILE IRR 1000ML POUR (IV SOLUTION) ×2 IMPLANT
WIRE FIX 1.5 STANDARD TIP (WIRE) ×4
WIRE FIX 1.5 STD TIP (WIRE) IMPLANT
YANKAUER SUCT BULB TIP NO VENT (SUCTIONS) IMPLANT

## 2021-08-05 NOTE — Discharge Instructions (Signed)
  Mercedes Briggs, M.D. Hand Surgery  POST-OPERATIVE DISCHARGE INSTRUCTIONS   PRESCRIPTIONS: You may have been given a prescription to be taken as directed for post-operative pain control.  You may also take over the counter ibuprofen/aleve and tylenol for pain. Take this as directed on the packaging. Do not exceed 3000 mg tylenol/acetaminophen in 24 hours.  Ibuprofen 600-800 mg (3-4) tablets by mouth every 6 hours as needed for pain.  OR Aleve 2 tablets by mouth every 12 hours (twice daily) as needed for pain.  AND/OR Tylenol 1000 mg (2 tablets) every 8 hours as needed for pain.  Please use your pain medication carefully, as refills are limited and you may not be provided with one.  As stated above, please use over the counter pain medicine - it will also be helpful with decreasing your swelling.    ANESTHESIA: After your surgery, post-surgical discomfort or pain is likely. This discomfort can last several days to a few weeks. At certain times of the day your discomfort may be more intense.   Did you receive a nerve block?  A nerve block can provide pain relief for one hour to two days after your surgery. As long as the nerve block is working, you will experience little or no sensation in the area the surgeon operated on.  As the nerve block wears off, you will begin to experience pain or discomfort. It is very important that you begin taking your prescribed pain medication before the nerve block fully wears off. Treating your pain at the first sign of the block wearing off will ensure your pain is better controlled and more tolerable when full-sensation returns. Do not wait until the pain is intolerable, as the medicine will be less effective. It is better to treat pain in advance than to try and catch up.   General Anesthesia:  If you did not receive a nerve block during your surgery, you will need to start taking your pain medication shortly after your surgery and should continue  to do so as prescribed by your surgeon.     ICE AND ELEVATION: You may use ice for the first 48-72 hours, but it is not critical.   Motion of your fingers is very important to decrease the swelling.  Elevation, as much as possible for the next 48 hours, is critical for decreasing swelling as well as for pain relief. Elevation means when you are seated or lying down, you hand should be at or above your heart. When walking, the hand needs to be at or above the level of your elbow.  If the bandage gets too tight, it may need to be loosened. Please contact our office and we will instruct you in how to do this.    SURGICAL BANDAGES:  Keep your dressing and/or splint clean and dry at all times.  Do not remove until you are seen again in the office.  If careful, you may place a plastic bag over your bandage and tape the end to shower, but be careful, do not get your bandages wet.     HAND THERAPY:  You may not need any. If you do, we will begin this at your follow up visit in the clinic.    ACTIVITY AND WORK: You are encouraged to move any fingers which are not in the bandage.  Light use of the fingers is allowed to assist the other hand with daily hygiene and eating, but strong gripping or lifting is often uncomfortable and   should be avoided.  You might miss a variable period of time from work and hopefully this issue has been discussed prior to surgery. You may not do any heavy work with your affected hand for about 2 weeks.    [facility] [address] [place],  [place]  [address] [phone]  

## 2021-08-05 NOTE — Anesthesia Postprocedure Evaluation (Signed)
Anesthesia Post Note ? ?Patient: Mercedes Briggs ? ?Procedure(s) Performed: LEFT OPEN REDUCTION INTERNAL FIXATION (ORIF) DISTAL RADIAL FRACTURE (Left: Wrist) ? ?  ? ?Patient location during evaluation: PACU ?Anesthesia Type: Regional ?Level of consciousness: awake and alert ?Pain management: pain level controlled ?Vital Signs Assessment: post-procedure vital signs reviewed and stable ?Respiratory status: spontaneous breathing, nonlabored ventilation and respiratory function stable ?Cardiovascular status: blood pressure returned to baseline ?Postop Assessment: no apparent nausea or vomiting ?Anesthetic complications: no ? ? ?No notable events documented. ? ?Last Vitals:  ?Vitals:  ? 08/05/21 1605 08/05/21 1620  ?BP: 113/69 126/69  ?Pulse: 66 70  ?Resp: 12 16  ?Temp: 37.2 ?C   ?SpO2: 97% 97%  ?  ?Last Pain:  ?Vitals:  ? 08/05/21 1620  ?TempSrc:   ?PainSc: 0-No pain  ? ? ?  ?  ?  ?  ?  ?  ? ?Marthenia Rolling ? ? ? ? ?

## 2021-08-05 NOTE — Brief Op Note (Signed)
08/05/2021 ? ?3:19 PM ? ?PATIENT:  Mercedes Briggs  71 y.o. female ? ?PRE-OPERATIVE DIAGNOSIS:  LEFT DISTAL RADIUS FRACTURE ? ?POST-OPERATIVE DIAGNOSIS:  LEFT DISTAL RADIUS FRACTURE ? ?PROCEDURE:  Procedure(s): ?LEFT OPEN REDUCTION INTERNAL FIXATION (ORIF) DISTAL RADIAL FRACTURE (Left) ? ?SURGEON:  Surgeon(s) and Role: ?   * Sherilyn Cooter, MD - Primary ? ?PHYSICIAN ASSISTANT:  ? ?ASSISTANTS: none  ? ?ANESTHESIA:   regional and MAC ? ?EBL:  10 mL  ? ?BLOOD ADMINISTERED:none ? ?DRAINS: none  ? ?LOCAL MEDICATIONS USED:  NONE ? ?SPECIMEN:  No Specimen ? ?DISPOSITION OF SPECIMEN:  N/A ? ?COUNTS:  YES ? ?TOURNIQUET:   ?Total Tourniquet Time Documented: ?Forearm (Left) - 58 minutes ?Total: Forearm (Left) - 58 minutes ? ? ?DICTATION: .Dragon Dictation ? ?PLAN OF CARE: Discharge to home after PACU ? ?PATIENT DISPOSITION:  PACU - hemodynamically stable. ?  ?Delay start of Pharmacological VTE agent (>24hrs) due to surgical blood loss or risk of bleeding: not applicable ? ?

## 2021-08-05 NOTE — Anesthesia Preprocedure Evaluation (Addendum)
Anesthesia Evaluation  ?Patient identified by MRN, date of birth, ID band ?Patient awake ? ? ? ?Reviewed: ?Allergy & Precautions, NPO status , Patient's Chart, lab work & pertinent test results ? ?History of Anesthesia Complications ?Negative for: history of anesthetic complications ? ?Airway ?Mallampati: II ? ?TM Distance: >3 FB ?Neck ROM: Full ? ? ? Dental ?no notable dental hx. ? ?  ?Pulmonary ?neg pulmonary ROS,  ?  ?Pulmonary exam normal ? ? ? ? ? ? ? Cardiovascular ?hypertension, Pt. on medications ?Normal cardiovascular exam ? ? ?  ?Neuro/Psych ?negative neurological ROS ? negative psych ROS  ? GI/Hepatic ?negative GI ROS, Neg liver ROS,   ?Endo/Other  ?Hypothyroidism  ? Renal/GU ?negative Renal ROS  ?negative genitourinary ?  ?Musculoskeletal ?LEFT DISTAL RADIUS FRACTURE  ? Abdominal ?  ?Peds ? Hematology ?negative hematology ROS ?(+)   ?Anesthesia Other Findings ?Day of surgery medications reviewed with patient. ? Reproductive/Obstetrics ?negative OB ROS ? ?  ? ? ? ? ? ? ? ? ? ? ? ? ? ?  ?  ? ? ? ? ? ? ? ?Anesthesia Physical ?Anesthesia Plan ? ?ASA: 2 ? ?Anesthesia Plan: Regional and MAC  ? ?Post-op Pain Management: Tylenol PO (pre-op)*  ? ?Induction:  ? ?PONV Risk Score and Plan: 2 and Treatment may vary due to age or medical condition, Ondansetron, Propofol infusion and Midazolam ? ?Airway Management Planned: Natural Airway and Simple Face Mask ? ?Additional Equipment: None ? ?Intra-op Plan:  ? ?Post-operative Plan:  ? ?Informed Consent: I have reviewed the patients History and Physical, chart, labs and discussed the procedure including the risks, benefits and alternatives for the proposed anesthesia with the patient or authorized representative who has indicated his/her understanding and acceptance.  ? ? ? ? ? ?Plan Discussed with: CRNA ? ?Anesthesia Plan Comments:   ? ? ? ? ? ?Anesthesia Quick Evaluation ? ?

## 2021-08-05 NOTE — Anesthesia Procedure Notes (Signed)
Anesthesia Regional Block: Supraclavicular block  ? ?Pre-Anesthetic Checklist: , timeout performed,  Correct Patient, Correct Site, Correct Laterality,  Correct Procedure, Correct Position, site marked,  Risks and benefits discussed,  Pre-op evaluation,  At surgeon's request and post-op pain management ? ?Laterality: Left ? ?Prep: Maximum Sterile Barrier Precautions used, chloraprep     ?  ?Needles:  ?Injection technique: Single-shot ? ?Needle Type: Echogenic Stimulator Needle   ? ? ?Needle Length: 9cm  ?Needle Gauge: 22  ? ? ? ?Additional Needles: ? ? ?Procedures:,,,, ultrasound used (permanent image in chart),,    ?Narrative:  ?Start time: 08/05/2021 12:30 PM ?End time: 08/05/2021 12:33 PM ?Injection made incrementally with aspirations every 5 mL. ? ?Performed by: Personally  ?Anesthesiologist: Kaylyn Layer, MD ? ?Additional Notes: ?Risks, benefits, and alternative discussed. Patient gave consent for procedure. Patient prepped and draped in sterile fashion. Sedation administered, patient remains easily responsive to voice. Relevant anatomy identified with ultrasound guidance. Local anesthetic given in 5cc increments with no signs or symptoms of intravascular injection. No pain or paraesthesias with injection. Patient monitored throughout procedure with signs of LAST or immediate complications. Tolerated well. Ultrasound image placed in chart.  ?Amalia Greenhouse, MD ? ? ? ? ? ? ?

## 2021-08-05 NOTE — Op Note (Signed)
? ?Date of Surgery: 08/05/2021 ? ?INDICATIONS: Patient is a 71 y.o.-year-old female with left distal radius fracture after a ground level fall on 07/29/21.  She was found to have a volar shear type distal radius fracture.  Preoperative CT scan demonstrated that the volar fragment was one large piece without a separate volar corner fragment.  Risks, benefits, and alternatives to surgery were again discussed with the patient in the preoperative area. The patient wishes to proceed with surgery.  Informed consent was signed after our discussion.  ? ?PREOPERATIVE DIAGNOSIS:  ?Left intra-articular distal radius fracture, 2 articular fragments ? ?POSTOPERATIVE DIAGNOSIS: Same. ? ?PROCEDURE:  ?Open reduction internal fixation left distal radius fracture, intra-articular ? ? ?SURGEON: Audria Nine, M.D. ? ?ASSIST:  ? ?ANESTHESIA:  Regional, MAC ? ?IV FLUIDS AND URINE: See anesthesia. ? ?ESTIMATED BLOOD LOSS: 10 mL. ? ?IMPLANTS:  ?Implant Name Type Inv. Item Serial No. Manufacturer Lot No. LRB No. Used Action  ?PLATE STD 3H LEFT - [id number] Plate PLATE STD 3H LEFT  [employer]  Left 1 Implanted  ?PEG GEMINUS LOCK HCLP 2.7X19 - [id number] Peg PEG GEMINUS LOCK HCLP 2.7X19  SKELET[employer]YNAM[employer]  Left 1 Implanted and Explanted  ?PEG GEMINUS THRD TPNL 2.7X20 - [id number] Peg PEG GEMINUS THRD TPNL 2.7X20  SKELETAL [employer]YNAM[employer]  Left 1 Implanted and Explanted  ?PEG GEMINUS THRD TPNL 2.7X22 - [id number] Peg PEG GEMINUS THRD TPNL 2.7X22  SKELET[employer]YNAM[employer]  Left 1 Implanted and Explanted  ?PEG NON LOCK THREAD 2.7X24MM - [id number] Screw PEG NON LOCK THREAD 2.7X24MM  SKELET[employer]YNAM[employer]  Left 1 Implanted and Explanted  ?PEG SMOOTH LOCK 2.0X18 - [id number] Screw PEG SMOOTH LOCK 2.0X18  SKELETAL [employer]YNAM[employer]  Left 3 Implanted  ?PEG GEMINUS SMOOTH LOCK 2.0X19 - [id number] Peg PEG GEMINUS SMOOTH LOCK 2.0X19  SKELETAL DYNAMICS  Left 3 Implanted  ?SCREW NONLOCK POLYAXIAL 3.5X11 - [id number] Screw SCREW NONLOCK POLYAXIAL 3.5X11  SKELETAL DYNAMICS  Left 1  Implanted  ?SCREW CORT LOCK 3.5X12 TI - [id number] Screw SCREW CORT LOCK 3.5X12 TI  SKELETAL [employer]YNAM[employer]  Left 1 Implanted  ?SCREW GEMINUS CORT LOCK 3.5X11 - [id number] Screw SCREW GEMINUS CORT LOCK 3.5X11  SKELETAL [employer]YNAM[employer]  Left 1 Implanted  ?PEG SMOOTH LOCKING 21MM - [id number] Peg PEG SMOOTH LOCKING 21MM  SKELETAL [employer]YNAM[employer]  Left 1 Implanted  ?  ? ?DRAINS: None ? ?COMPLICATIONS: None ? ?DESCRIPTION OF PROCEDURE: The patient was met in the preoperative holding area where the surgical site was marked and the consent form was verified.  The patient was then taken to the operating room and transferred to the operating table.  All bony prominences were well padded.  A tourniquet was applied to the left upper arm.  Monitored sedation was induced.  The operative extremity was prepped and draped in the usual and sterile fashion.  A formal time-out was performed to confirm that this was the correct patient, surgery, side, and site.  ? ?Following timeout, the limb was gently exsanguinated with an Esmarch bandage and tourniquet inflated to 250 mmHg.  An extended FCR approach was made.  The skin and subcutaneous tissue was divided.  The volar FCR sheath was incised.  Superficial branch of radial artery was identified distally and protected.  The FCR sheath was then opened all the way to the distal aspect of the radius.  The FCR tendon was then retracted radially and the floor of the FCR tendon sheath was sharply incised.  The FPL was identified and was bluntly swept in an ulnar direction developing  Parona space.  The pronator quadratus was identified and an L-shaped incision was made in the pronator distally and along the radial aspect of the distal radius.  The pronator was sharply elevated using a 15 blade and subperiosteally dissected using a periosteal elevator.  The brachioradialis tendon was identified and a tenotomy was performed taking care to protect the underlying fourth dorsal compartment tendons.  The fracture was  identified.  There was a volar shear type of fracture.  The distal fragment appeared to be one piece.  The fracture was reduced with longitudinal traction and direct pressure on the distal fragment.  A 3 hole standard left Geminus plate was selected.  The plate was placed in the central portion of the distal radius.  The oblong hole was drilled bicortically and a  nonlocking screw was placed.  A lateral view demonstrated that the plate was in appropriate position at the watershed line.  2 K wires were then placed in the aiming guides of the distal aspect of the plate.  The lateral view again demonstrated that these wires were in a good subchondral position.  The fracture was well reduced on both AP and lateral views.  A 2.7 bicortical nonlocking screw was placed in both the ulnar and radial heads of the plate to compress the plate against the bone.  The remaining distal holes were then filled with unicortical smooth locking pegs.  The previously placed aiming guides were removed and the final hole of the ulnar head and the radial styloid screw was placed.  The previously placed bicortical nonlocking screws were then replaced with smooth locking pegs.  The 2 remaining shaft holes were filled with bicortical locking screws.  AP and lateral views with all the screws placed demonstrated that the plate was in appropriate position.  The distal screws were in appropriate subchondral position and did not penetrate the dorsal cortex.  The fracture was anatomically reduced.  The wrist was taken through range of motion and there was no motion at the fracture site.  The wrist was extended and axial load applied to stress the volar lip of the distal radius and this also was found to be stable.  The wound was then thoroughly irrigated with copious sterile saline.  The wound was then closed in a layered fashion.  A 3-0 Monocryl was used to reapproximate the pronator quadratus.  The tourniquet was let down hemostasis was achieved  with direct pressure.  The fingers are all warm, pink, and well-perfused.  The skin was then closed using 3-0 Monocryl in a buried subcutaneous fashion followed by a 4-0 nylon suture in horizontal mattress fashion.  Wound was dressed with Xeroform, folded Kerlix, cast padding, and a well-padded 4 inch volar splint was applied. ? ?The patient was then reversed from anesthesia and transferred to the postoperative bed.  All counts were correct at the end of the procedure.  Patient was then taken to PACU in stable condition ? ?POSTOPERATIVE PLAN: She will be discharged to home with appropriate pain medication and discharge instructions.  I will see her back in the office in 10 to 14 days for her first postop visit.  We will then start her in hand therapy. ? ?Audria Nine, MD ?3:26 PM  ?

## 2021-08-05 NOTE — Anesthesia Procedure Notes (Signed)
Procedure Name: [place] ?Date/Time: 08/05/2021 2:00 PM ?Performed by: Valda Favia, CRNA ?Pre-anesthesia Checklist: Patient identified, Emergency Drugs available, Suction available, Patient being monitored and Timeout performed ?Patient Re-evaluated:Patient Re-evaluated prior to induction ?Oxygen Delivery Method: Simple face mask ?Preoxygenation: Pre-oxygenation with 100% oxygen ?Induction Type: IV induction ?Placement Confirmation: positive ETCO2 ?Dental Injury: Teeth and Oropharynx as per pre-operative assessment  ? ? ? ? ?

## 2021-08-05 NOTE — Interval H&P Note (Signed)
History and Physical Interval Note: ? ?08/05/2021 ?1:10 PM ? ?Mercedes Briggs  has presented today for surgery, with the diagnosis of LEFT DISTAL RADIUS FRACTURE.  The various methods of treatment have been discussed with the patient and family. After consideration of risks, benefits and other options for treatment, the patient has consented to  Procedure(s): ?LEFT OPEN REDUCTION INTERNAL FIXATION (ORIF) DISTAL RADIAL FRACTURE (Left) as a surgical intervention.  The patient's history has been reviewed, patient examined, no change in status, stable for surgery.  I have reviewed the patient's chart and labs.  Questions were answered to the patient's satisfaction.   ? ? ?[name] [name] ? ? ?

## 2021-08-05 NOTE — Transfer of Care (Signed)
Immediate Anesthesia Transfer of Care Note ? ?Patient: Mercedes Briggs ? ?Procedure(s) Performed: LEFT OPEN REDUCTION INTERNAL FIXATION (ORIF) DISTAL RADIAL FRACTURE (Left: Wrist) ? ?Patient Location: PACU ? ?Anesthesia Type:MAC and Regional ? ?Level of Consciousness: awake, alert  and oriented ? ?Airway & Oxygen Therapy: Patient Spontanous Breathing ? ?Post-op Assessment: Report given to RN and Post -op Vital signs reviewed and stable ? ?Post vital signs: Reviewed and stable ? ?Last Vitals:  ?Vitals Value Taken Time  ?BP 114/45 08/05/21 1533  ?Temp    ?Pulse 76 08/05/21 1543  ?Resp 20 08/05/21 1543  ?SpO2 97 % 08/05/21 1543  ?Vitals shown include unvalidated device data. ? ?Last Pain:  ?Vitals:  ? 08/05/21 1221  ?TempSrc:   ?PainSc: 4   ?   ? ?Patients Stated Pain Goal: 1 (08/05/21 1221) ? ?Complications: No notable events documented. ?

## 2021-08-06 ENCOUNTER — Encounter (HOSPITAL_COMMUNITY): Payer: Self-pay | Admitting: Orthopedic Surgery

## 2021-08-16 ENCOUNTER — Encounter: Payer: Medicare Other | Admitting: Orthopedic Surgery

## 2021-08-19 ENCOUNTER — Ambulatory Visit (INDEPENDENT_AMBULATORY_CARE_PROVIDER_SITE_OTHER): Payer: Medicare Other | Admitting: Orthopedic Surgery

## 2021-08-19 ENCOUNTER — Ambulatory Visit (INDEPENDENT_AMBULATORY_CARE_PROVIDER_SITE_OTHER): Payer: Medicare Other

## 2021-08-19 DIAGNOSIS — S52562A Barton's fracture of left radius, initial encounter for closed fracture: Secondary | ICD-10-CM

## 2021-08-19 NOTE — Addendum Note (Signed)
Addended by: Marlyne Beards on: 08/19/2021 09:45 AM ? ? Modules accepted: Orders ? ?

## 2021-08-19 NOTE — Progress Notes (Signed)
? ?  Post-Op Visit Note ?  ?Patient: Mercedes Briggs           ?Date of Birth: January 20, 1951           ?MRN: [id number] ?Visit Date: 08/19/2021 ?PCP: Leamon Arnt, MD ? ? ?Assessment & Plan: ? ?Chief Complaint:  ?Chief Complaint  ?Patient presents with  ? Left Wrist - Routine Post Op  ? ?Visit Diagnoses:  ?1. Closed Barton's fracture of left radius, initial encounter   ? ? ?Plan: Patient is two weeks s/p ORIF volar Carron Brazen type fracture of the right distal radius.  X-rays today show maintained fracture reduction and appropriate hardware position.  Incision is clean, dry, well approximated.  Sutures removed.  Able to make a near complete fist but mildly limited by hand swelling.  SILT throughout hand.  We will start her in therapy and I will see her back in another month.  ? ?Follow-Up Instructions: No follow-ups on file.  ? ?Orders:  ?Orders Placed This Encounter  ?Procedures  ? XR Wrist Complete Left  ? ?No orders of the defined types were placed in this encounter. ? ? ?Imaging: ?No results found. ? ?PMFS History: ?Patient Active Problem List  ? Diagnosis Date Noted  ? Closed fracture of left distal radius 07/30/2021  ? 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 Refusal of statin medication by patient 09/26/2019  ? Acquired hypothyroidism 12/06/2015  ? White coat syndrome with diagnosis of hypertension 12/06/2015  ? 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 Mixed hyperlipidemia 12/06/2015  ? ?Past Medical History:  ?Diagnosis Date  ? 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 Dexa 11/2019: T = - 2.5 lumbar spine. Offer treatment.   ? Herniated disc, cervical   ? Hyperlipidemia   ? Hypertension   ? Hypothyroidism   ?  ?Family History  ?Problem Relation Age of Onset  ? Alzheimer's disease Mother   ? Arthritis Mother   ? Atrial fibrillation Mother   ? Dementia Mother   ? Hypertension Mother   ? Asthma Father   ? COPD Father   ? Heart disease Father   ? Hypertension Father   ? Diabetes Brother   ? Neurodegenerative  disease Brother   ?     storage cell disease  ?  ?Past Surgical History:  ?Procedure Laterality Date  ? ABDOMINAL HYSTERECTOMY    ? ANTERIOR CERVICAL DISCECTOMY    ? CESAREAN SECTION    ? OPEN REDUCTION INTERNAL FIXATION (ORIF) DISTAL RADIAL FRACTURE Left 08/05/2021  ? Procedure: LEFT OPEN REDUCTION INTERNAL FIXATION (ORIF) DISTAL RADIAL FRACTURE;  Surgeon: Sherilyn Cooter, MD;  Location: [facility];  Service: Orthopedics;  Laterality: Left;  ? TONSILLECTOMY    ? ?Social History  ? ?Occupational History  ? 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 Smoking status: Never  ? Smokeless tobacco: Never  ? Tobacco comments:  ?  occasionally   ?Vaping Use  ? Vaping Use: Never used  ?Substance and Sexual Activity  ? Alcohol use: Yes  ?  Comment: socially  ? Drug use: Never  ? Sexual activity: Yes  ?  Birth control/protection: Post-menopausal  ? ? ? ?

## 2021-08-21 ENCOUNTER — Encounter: Payer: Self-pay | Admitting: Rehabilitative and Restorative Service Providers"

## 2021-08-21 ENCOUNTER — Other Ambulatory Visit: Payer: Self-pay

## 2021-08-21 ENCOUNTER — Ambulatory Visit (INDEPENDENT_AMBULATORY_CARE_PROVIDER_SITE_OTHER): Payer: Medicare Other | Admitting: Rehabilitative and Restorative Service Providers"

## 2021-08-21 DIAGNOSIS — R600 Localized edema: Secondary | ICD-10-CM

## 2021-08-21 DIAGNOSIS — M6281 Muscle weakness (generalized): Secondary | ICD-10-CM

## 2021-08-21 DIAGNOSIS — R278 Other lack of coordination: Secondary | ICD-10-CM

## 2021-08-21 DIAGNOSIS — M25532 Pain in left wrist: Secondary | ICD-10-CM

## 2021-08-21 DIAGNOSIS — M25632 Stiffness of left wrist, not elsewhere classified: Secondary | ICD-10-CM | POA: Diagnosis not present

## 2021-08-21 DIAGNOSIS — M25642 Stiffness of left hand, not elsewhere classified: Secondary | ICD-10-CM

## 2021-08-21 NOTE — Therapy (Signed)
?OUTPATIENT OCCUPATIONAL THERAPY ORTHO EVALUATION ? ?Patient Name: Mercedes Briggs ?MRN: [id number] ?DOB:05-16-1950, 71 y.o., female ?Today's Date: 08/21/2021 ? ?PCP: Dr. Billey Chang ?REFERRING PROVIDER: Dr. Sherilyn Cooter  ? ? OT End of Session - 08/21/21 1245   ? ? Visit Number 1   ? Number of Visits 14   ? Date for OT Re-Evaluation 10/16/21   ? Authorization Type Medicare & AARP   ? Progress Note Due on Visit 10   ? OT Start Time 1300   ? OT Stop Time 1416   ? OT Time Calculation (min) 76 min   ? Equipment Utilized During Treatment orthotic materials   ? Activity Tolerance Patient tolerated treatment well;No increased pain;Patient limited by fatigue;Patient limited by pain   ? Behavior During Therapy [facility] for tasks assessed/performed   ? ?  ?  ? ?  ? ? ?Past Medical History:  ?Diagnosis Date  ? 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 Dexa 11/2019: T = - 2.5 lumbar spine. Offer treatment.   ? Herniated disc, cervical   ? Hyperlipidemia   ? Hypertension   ? Hypothyroidism   ? ?Past Surgical History:  ?Procedure Laterality Date  ? ABDOMINAL HYSTERECTOMY    ? ANTERIOR CERVICAL DISCECTOMY    ? CESAREAN SECTION    ? OPEN REDUCTION INTERNAL FIXATION (ORIF) DISTAL RADIAL FRACTURE Left 08/05/2021  ? Procedure: LEFT OPEN REDUCTION INTERNAL FIXATION (ORIF) DISTAL RADIAL FRACTURE;  Surgeon: Sherilyn Cooter, MD;  Location: [facility];  Service: Orthopedics;  Laterality: Left;  ? TONSILLECTOMY    ? ?Patient Active Problem List  ? Diagnosis Date Noted  ? Closed fracture of left distal radius 07/30/2021  ? 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 Refusal of statin medication by patient 09/26/2019  ? Acquired hypothyroidism 12/06/2015  ? White coat syndrome with diagnosis of hypertension 12/06/2015  ? 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 Mixed hyperlipidemia 12/06/2015  ? ? ?ONSET DATE: DOS 08/05/21 ? ?REFERRING DIAG: G62.694W (ICD-10-CM) - Closed Barton's fracture of left radius, initial encounter ? ?THERAPY DIAG:   ?Stiffness of left wrist, not elsewhere classified - Plan: Ot plan of care cert/re-cert ? ?Pain in left wrist - Plan: Ot plan of care cert/re-cert ? ?Stiffness of left hand, not elsewhere classified - Plan: Ot plan of care cert/re-cert ? ?Muscle weakness (generalized) - Plan: Ot plan of care cert/re-cert ? ?Localized edema - Plan: Ot plan of care cert/re-cert ? ?Other lack of coordination - Plan: Ot plan of care cert/re-cert ? ?SUBJECTIVE:  ? ?SUBJECTIVE STATEMENT: ?She states she was walking dog, had [place], broke her left wrist. Initially ED did not see fx on x-ray, but CT showed and Br. [place] did an ORIF. She states only mild pain now but she has been "trying to use it" in past week which was pain.  ? ? ?PERTINENT HISTORY: Per MD: "Left distal radius fracture s/p ORIF"  ? ?PRECAUTIONS: 2+ weeks out from sx now, NWB in Left hand/wrist now,  ? ?WEIGHT BEARING RESTRICTIONS Yes limit to 3-5# for next 4 weeks  ? ?PAIN:  ?Are you having pain? Yes ?Rating: 1.5/10 at rest now, up to /10 in past week when gripping for fnl use ? ?FALLS: Has patient fallen in last 6 months? Yes. Number of falls 1 (this accident)  ? ?LIVING ENVIRONMENT: ?Lives with: lives with their spouse ? ?PLOF: Independent ? ?PATIENT GOALS get back to new sewing machine  ? ?OBJECTIVE:  ? ?HAND DOMINANCE: Right ? ?ADLs: ?Overall ADLs: Pain and  problems doing home care, putting on socks, fasteners, etc.  ? ?FUNCTIONAL OUTCOME MEASURES: ?Eval: Quick Dash: 27% impairment today ? ?UE ROM   Eval: 1.3cm gap from pad of MF to [facility] today (but does have hx of trigger fingers and limited mobility at baseline in Lt hand). Lt thumb opposes 2cm gap to base of SF, but full opposition to pad of SF.  ? ?Active ROM Left ?08/21/2021  ?Shoulder flexion 144*  ?Shoulder abduction   ?Shoulder adduction   ?Shoulder extension   ?Shoulder internal rotation   ?Shoulder external rotation   ?Elbow flexion 150*  ?Elbow extension (-20*)   ?Wrist flexion 40  ?Wrist extension 35 (63*  in rt)   ?Wrist pronation 80*  ?Wrist supination 80* pulls  ?(Blank rows = not tested) ? ? ?UE MMT:    ? ?MMT Left ?08/21/2021  ?Shoulder flexion   ?Shoulder abduction   ?Shoulder adduction   ?Shoulder extension   ?Shoulder internal rotation   ?Shoulder external rotation   ?Middle trapezius   ?Lower trapezius   ?Elbow flexion   ?Elbow extension   ?Wrist flexion   ?Wrist extension   ?Wrist ulnar deviation   ?Wrist radial deviation   ?Wrist pronation   ?Wrist supination   ?(Blank rows = not tested) ? ?HAND FUNCTION: ?Grip strength: Right: 57 lbs; Left: TBD lbs ? ?COORDINATION: ?Box and Blocks: Left TBDblocks ? ?SENSATION: ?LT intact in Left hand, some c/o about paresthesia in left neck/shoulder since fall.  ? ?EDEMA:  ?Eval: 19.9cm circumferentially around Lt MCP Js.  ? ?COGNITION: ?Overall cognitive status: Within functional limits for tasks assessed ? ?OBSERVATIONS: 08/21/21: pt seems to have low pain, good finger motion, very stiff at wrist, but fairly good FA motion as well.  ? ? ?TODAY'S TREATMENT:  ?08/21/21 Eval: Custom orthotic fabrication was indicated due to pt's healing wrist fx and need for safe, functional positioning. OT fabricated custom left wrist immobilizer orthotic for pt today to secure left wrist. It fit well with no areas of pressure, pt states a comfortable fit. Pt was educated on the wearing schedule, to call or come in ASAP if it is causing any irritation or is not achieving desired function. It will be checked/adjusted in upcoming sessions, as needed. Pt states understanding.  ? ?OT also goes over self-care skin care, precautions with weight lifting and fnl activities, hygiene, etc. Also initial home exercises for the below shoulder to hand motions with over pressure. She does well, states understanding.  ? ?Exercises ?- Standing Shoulder Flexion Full Range  - 3-4 x daily - 1-2 sets - 10-15 reps ?- Bend and Straighten Elbow  - 3-4 x daily - 1-2 sets - 10-15 reps ?- Palm Up / Palm Down  - 3-4 x  daily - 1-2 sets - 10 reps ?- Bend and Pull Back Wrist SLOWLY  - 3-4 x daily - 1-2 sets - 10-15 reps ?- "Windshield Wipers"   - 3-4 x daily - 1-2 sets - 10-15 reps ?- Wrist AROM Dart Throwers Motion  - 3-4 x daily - 1-2 sets - 10-15 reps ?- Tendon Glides  - 3-4 x daily - 3-5 reps - 2-3 seconds hold ? ?Patient Education ?- Scar Massage ? ?PATIENT EDUCATION: ?Education details: see tx section above for details  ?Person educated: Patient ?Education method: Explanation, Demonstration, Verbal cues, and Handouts ?Education comprehension: verbalized understanding, returned demonstration, verbal cues required, and needs further education ? ? ?HOME EXERCISE PROGRAM: ?Access Code: [email or web address] ?URL: [email or web address] ?Prepared by: Lauro Franklin  Juel Ripley ? ? ? ?GOALS: ?Goals reviewed with patient? Yes ? ?SHORT TERM GOALS: (STG required if POC>30 days) ? ?Pt will obtain protective, custom orthotic. ?Target date: 08/21/21 ?Goal status: MET ? ?2.  Pt will demo/state understanding of initial HEP to improve pain levels and prerequisite motion. ?Target date: 08/30/21 ?Goal status: INITIAL ? ? ?LONG TERM GOALS: ? ?Pt will improve functional ability by decreased impairment per Quick DASH assessment from 27% to 10% or better, for better quality of life. ?Target date: 10/16/21 ?Goal status: INITIAL ? ?2.  Pt will improve grip strength in left hand to at least 40lbs for functional use at home and in IADLs. ?Target date: 10/16/21 ?Goal status: INITIAL ? ?3.  Pt will improve A/ROM in left wrist flex and ext from 40/35 respectively to at least 55* each for functional motion for tasks like reach and grasp.  ?Target date: 10/16/21 ?Goal status: INITIAL ? ?4.  Pt will improve strength in left wrist from 3-/5 MMT painful to at least 4+/5 MMT to have increased functional ability to carry out selfcare and higher-level homecare tasks with no difficulty. ?Target date: 10/16/21 ?Goal status: INITIAL ? ? ? ?ASSESSMENT: ? ?CLINICAL  IMPRESSION: ?Patient is a 71 y.o. female who was seen today for occupational therapy evaluation for left wrist fx and subsequent pain, swelling and decreased functional ability.  ? ?PERFORMANCE DEFICITS in functional skills

## 2021-08-27 ENCOUNTER — Ambulatory Visit (INDEPENDENT_AMBULATORY_CARE_PROVIDER_SITE_OTHER): Payer: Medicare Other | Admitting: Rehabilitative and Restorative Service Providers"

## 2021-08-27 ENCOUNTER — Encounter: Payer: Self-pay | Admitting: Rehabilitative and Restorative Service Providers"

## 2021-08-27 DIAGNOSIS — R600 Localized edema: Secondary | ICD-10-CM | POA: Diagnosis not present

## 2021-08-27 DIAGNOSIS — R278 Other lack of coordination: Secondary | ICD-10-CM | POA: Diagnosis not present

## 2021-08-27 DIAGNOSIS — M25632 Stiffness of left wrist, not elsewhere classified: Secondary | ICD-10-CM

## 2021-08-27 DIAGNOSIS — M25642 Stiffness of left hand, not elsewhere classified: Secondary | ICD-10-CM | POA: Diagnosis not present

## 2021-08-27 DIAGNOSIS — M6281 Muscle weakness (generalized): Secondary | ICD-10-CM | POA: Diagnosis not present

## 2021-08-27 DIAGNOSIS — M25532 Pain in left wrist: Secondary | ICD-10-CM | POA: Diagnosis not present

## 2021-08-27 NOTE — Therapy (Signed)
OUTPATIENT OCCUPATIONAL THERAPY TREATMENT NOTE   Patient Name: Mercedes Briggs MRN: [id number] DOB:03/28/1951, 71 y.o., female Today's Date: 08/27/2021  PCP: Dr. Billey Chang REFERRING PROVIDER: Dr. Sherilyn Cooter   END OF SESSION:   OT End of Session - 08/27/21 0803     Visit Number 2    Number of Visits 14    Date for OT Re-Evaluation 10/16/21    Authorization Type Medicare & AARP    Progress Note Due on Visit 10    OT Start Time 0803    OT Stop Time 0845    OT Time Calculation (min) 42 min    Activity Tolerance Patient tolerated treatment well;No increased pain;Patient limited by pain    Behavior During Therapy [facility] for tasks assessed/performed             Past Medical History:  Diagnosis Date   Age related osteoporosis 11/28/2019   Dexa 11/2019: T = - 2.5 lumbar spine. Offer treatment.    Herniated disc, cervical    Hyperlipidemia    Hypertension    Hypothyroidism    Past Surgical History:  Procedure Laterality Date   ABDOMINAL HYSTERECTOMY     ANTERIOR CERVICAL DISCECTOMY     CESAREAN SECTION     OPEN REDUCTION INTERNAL FIXATION (ORIF) DISTAL RADIAL FRACTURE Left 08/05/2021   Procedure: LEFT OPEN REDUCTION INTERNAL FIXATION (ORIF) DISTAL RADIAL FRACTURE;  Surgeon: Sherilyn Cooter, MD;  Location: [place];  Service: Orthopedics;  Laterality: Left;   TONSILLECTOMY     Patient Active Problem List   Diagnosis Date Noted   Closed fracture of left distal radius 07/30/2021   Age related osteoporosis 11/28/2019   Refusal of statin medication by patient 09/26/2019   Acquired hypothyroidism 12/06/2015   White coat syndrome with diagnosis of hypertension 12/06/2015   History of fusion of cervical spine 12/06/2015   Mixed hyperlipidemia 12/06/2015    PCP: Dr. Billey Chang REFERRING PROVIDER: Dr. Sherilyn Cooter   THERAPY DIAG:  Pain in left wrist  Muscle weakness (generalized)  Other lack of coordination  Localized edema  Stiffness of left hand,  not elsewhere classified  Stiffness of left wrist, not elsewhere classified  Rationale for Evaluation and Treatment Rehabilitation  PERTINENT HISTORY: Per MD: "Left distal radius fracture s/p ORIF"    PRECAUTIONS: 3 weeks out from sx now, NWB in Left hand/wrist now,   WEIGHT BEARING RESTRICTIONS Yes limit to 3-5# for next 4 weeks   SUBJECTIVE:  She states doing HEP was a bit "hard" and she felt sore at times. Also states making the bed and trying to do dishes.   PAIN:  Are you having pain? No Rating: 0/10 at rest now    OBJECTIVE: (All objective assessments below are from initial evaluation on: 08/21/21 unless otherwise specified.)   HAND DOMINANCE: Right   ADLs: Overall ADLs: Pain and problems doing home care, putting on socks, fasteners, etc.    FUNCTIONAL OUTCOME MEASURES: Eval: Quick Dash: 27% impairment today   UE ROM   Eval: 1.3cm gap from pad of MF to [facility] today (but does have hx of trigger fingers and limited mobility at baseline in Lt hand). Lt thumb opposes 2cm gap to base of SF, but full opposition to pad of SF.    Active ROM Left 08/21/2021  Shoulder flexion 144*  Shoulder abduction    Shoulder adduction    Shoulder extension    Shoulder internal rotation    Shoulder external rotation    Elbow flexion 150*  Elbow  extension (-20*)   Wrist flexion 40  Wrist extension 35 (63* in rt)   Wrist pronation 80*  Wrist supination 80* pulls  (Blank rows = not tested)     UE MMT:      MMT Left 08/21/2021  Shoulder flexion    Shoulder abduction    Shoulder adduction    Shoulder extension    Shoulder internal rotation    Shoulder external rotation    Middle trapezius    Lower trapezius    Elbow flexion    Elbow extension    Wrist flexion    Wrist extension    Wrist ulnar deviation    Wrist radial deviation    Wrist pronation    Wrist supination    (Blank rows = not tested)   HAND FUNCTION: Grip strength: Right: 57 lbs; Left: TBD lbs    COORDINATION: Box and Blocks: Left TBDblocks   SENSATION: LT intact in Left hand, some c/o about paresthesia in left neck/shoulder since fall.    EDEMA:  Eval: 19.9cm circumferentially around Lt MCP Js.    COGNITION: Overall cognitive status: Within functional limits for tasks assessed   OBSERVATIONS: 08/21/21: pt seems to have low pain, good finger motion, very stiff at wrist, but fairly good FA motion as well.      TODAY'S TREATMENT:  08/27/21: She reviews all EP AROM, also tolerates MH well (reduce pain, no irritation), then upgrades HEP to PROM/stretches at forearm, wrist and fingers as tol. She states no questions at end, tolerates well after cues and supervision. Denies need for ice for soreness. Reminded of no significant lifting or repetitive functional use for at least 2-3 more weeks.    Exercises - Standing Shoulder Flexion Full Range  - 3-4 x daily - 1-2 sets - 10-15 reps - Bend and Straighten Elbow  - 3-4 x daily - 1-2 sets - 10-15 reps - Forearm Supination Stretch  - 3-4 x daily - 3-5 reps - 15 sec hold - Wrist Pronation Stretch  - 4-6 x daily - 1 sets - 10-15 reps - Wrist Extension Stretch Pronated  - 3-4 x daily - 3-5 reps - 15 hold - Seated Wrist Flexion with Overpressure  - 3-4 x daily - 3-5 reps - 15 sec hold - Palm Up / Palm Down  - 3-4 x daily - 1-2 sets - 10 reps - Bend and Pull Back Wrist SLOWLY  - 3-4 x daily - 1-2 sets - 10-15 reps - "Windshield Wipers"   - 3-4 x daily - 1-2 sets - 10-15 reps - Wrist AROM Dart Throwers Motion  - 3-4 x daily - 1-2 sets - 10-15 reps - Seated Finger Composite Flexion Stretch  - 4-6 x daily - 3-5 reps - 15 hold - Seated Thumb MP Flexion PROM  - 4-6 x daily - 1 sets - 10-15 reps - Tendon Glides  - 3-4 x daily - 3-5 reps - 2-3 seconds hold  Patient Education - Scar Massage   PATIENT EDUCATION: Education details: see tx section above for details  Person educated: Patient Education method: Explanation, Demonstration, Verbal  cues, and Handouts Education comprehension: verbalized understanding, returned demonstration, verbal cues required, and needs further education     HOME EXERCISE PROGRAM: Access Code: [email or web address] URL: [email or web address] Prepared by: Benito Mccreedy       GOALS: Goals reviewed with patient? Yes   SHORT TERM GOALS: (STG required if POC>30 days)   Pt will obtain protective, custom orthotic. Target date: 08/21/21 Goal  status: MET   2.  Pt will demo/state understanding of initial HEP to improve pain levels and prerequisite motion. Target date: 08/30/21 Goal status: INITIAL     LONG TERM GOALS:   Pt will improve functional ability by decreased impairment per Quick DASH assessment from 27% to 10% or better, for better quality of life. Target date: 10/16/21 Goal status: INITIAL   2.  Pt will improve grip strength in left hand to at least 40lbs for functional use at home and in IADLs. Target date: 10/16/21 Goal status: INITIAL   3.  Pt will improve A/ROM in left wrist flex and ext from 40/35 respectively to at least 55* each for functional motion for tasks like reach and grasp.  Target date: 10/16/21 Goal status: INITIAL   4.  Pt will improve strength in left wrist from 3-/5 MMT painful to at least 4+/5 MMT to have increased functional ability to carry out selfcare and higher-level homecare tasks with no difficulty. Target date: 10/16/21 Goal status: INITIAL       ASSESSMENT:   CLINICAL IMPRESSION: 08/27/21: She does have some fear and apprehension to stretch, and she states "forgetting" about fx at times, possible overuse to a slight degree. Pt requesting 1 visit a week for now, but if motion doesn't significantly improve OT will urge her to increase to 2x week as initially recommended.    08/21/21: Patient is a 71 y.o. female who was seen today for occupational therapy evaluation for left wrist fx and subsequent pain, swelling and decreased functional ability.       PLAN: OT FREQUENCY: 2x/week   OT DURATION: 8 weeks   PLANNED INTERVENTIONS: self care/ADL training, therapeutic exercise, therapeutic activity, neuromuscular re-education, manual therapy, scar mobilization, splinting, electrical stimulation, ultrasound, moist heat, cryotherapy, contrast bath, patient/family education, coping strategies training, and DME and/or AE instructions   RECOMMENDED OTHER SERVICES: none now    CONSULTED AND AGREED WITH PLAN OF CARE: Patient   PLAN FOR NEXT SESSION:  Do box & blocks FNL activity, start weaning orthotic 1-2 hours in the day, take new measures, upgrade stretches to weighed (hammer, etc.) as needed/tolerated. Dynamic orthoses as indicated as well for stubborn motion.    Benito Mccreedy, OTR/L, CHT 08/27/2021, 10:44 AM

## 2021-08-28 ENCOUNTER — Encounter: Payer: Medicare Other | Admitting: Rehabilitative and Restorative Service Providers"

## 2021-09-02 ENCOUNTER — Ambulatory Visit: Payer: Medicare Other

## 2021-09-03 ENCOUNTER — Encounter: Payer: Self-pay | Admitting: Rehabilitative and Restorative Service Providers"

## 2021-09-03 ENCOUNTER — Ambulatory Visit (INDEPENDENT_AMBULATORY_CARE_PROVIDER_SITE_OTHER): Payer: Medicare Other | Admitting: Rehabilitative and Restorative Service Providers"

## 2021-09-03 DIAGNOSIS — M25632 Stiffness of left wrist, not elsewhere classified: Secondary | ICD-10-CM | POA: Diagnosis not present

## 2021-09-03 DIAGNOSIS — R278 Other lack of coordination: Secondary | ICD-10-CM

## 2021-09-03 DIAGNOSIS — M25532 Pain in left wrist: Secondary | ICD-10-CM | POA: Diagnosis not present

## 2021-09-03 DIAGNOSIS — M6281 Muscle weakness (generalized): Secondary | ICD-10-CM

## 2021-09-03 DIAGNOSIS — M25642 Stiffness of left hand, not elsewhere classified: Secondary | ICD-10-CM

## 2021-09-03 DIAGNOSIS — R600 Localized edema: Secondary | ICD-10-CM | POA: Diagnosis not present

## 2021-09-03 NOTE — Therapy (Signed)
OUTPATIENT OCCUPATIONAL THERAPY TREATMENT NOTE   Patient Name: Mercedes Briggs MRN: [id number] DOB:11-14-1950, 71 y.o., female Today's Date: 09/03/2021  PCP: Dr. Billey Chang REFERRING PROVIDER: Dr. Sherilyn Cooter   END OF SESSION:   OT End of Session - 09/03/21 0806     Visit Number 3    Number of Visits 14    Date for OT Re-Evaluation 10/16/21    Authorization Type Medicare & [employer]    Progress Note Due on Visit 10    OT Start Time 0806    OT Stop Time 0848    OT Time Calculation (min) 42 min    Activity Tolerance Patient tolerated treatment well;No increased pain;Patient limited by pain    Behavior During Therapy [facility] for tasks assessed/performed              Past Medical History:  Diagnosis Date   Age related osteoporosis 11/28/2019   Dexa 11/2019: T = - 2.5 lumbar spine. Offer treatment.    Herniated disc, cervical    Hyperlipidemia    Hypertension    Hypothyroidism    Past Surgical History:  Procedure Laterality Date   ABDOMINAL HYSTERECTOMY     ANTERIOR CERVICAL DISCECTOMY     CESAREAN SECTION     OPEN REDUCTION INTERNAL FIXATION (ORIF) DISTAL RADIAL FRACTURE Left 08/05/2021   Procedure: LEFT OPEN REDUCTION INTERNAL FIXATION (ORIF) DISTAL RADIAL FRACTURE;  Surgeon: Sherilyn Cooter, MD;  Location: [place];  Service: Orthopedics;  Laterality: Left;   TONSILLECTOMY     Patient Active Problem List   Diagnosis Date Noted   Closed fracture of left distal radius 07/30/2021   Age related osteoporosis 11/28/2019   Refusal of statin medication by patient 09/26/2019   Acquired hypothyroidism 12/06/2015   White coat syndrome with diagnosis of hypertension 12/06/2015   History of fusion of cervical spine 12/06/2015   Mixed hyperlipidemia 12/06/2015    PCP: Dr. Billey Chang REFERRING PROVIDER: Dr. Sherilyn Cooter   THERAPY DIAG:  Pain in left wrist  Muscle weakness (generalized)  Other lack of coordination  Stiffness of left wrist, not elsewhere  classified  Stiffness of left hand, not elsewhere classified  Localized edema  Rationale for Evaluation and Treatment Rehabilitation  PERTINENT HISTORY: Per MD: "Left distal radius fracture s/p ORIF"    PRECAUTIONS: 4 weeks out from sx now, NWB in Left hand/wrist now,   WEIGHT BEARING RESTRICTIONS Yes limit to 3-5# for next 4 weeks   SUBJECTIVE:  She states doing HEP without much issues, but she c/o of night pain in dorsum of hand.  OT edu to watch habits, watch fist position and try last stretches & ice right before bed if able.   PAIN:  Are you having pain? Not now at rest Rating: 0/10 at rest now    OBJECTIVE: (All objective assessments below are from initial evaluation on: 08/21/21 unless otherwise specified.)   HAND DOMINANCE: Right   ADLs: Overall ADLs: Pain and problems doing home care, putting on socks, fasteners, etc.    FUNCTIONAL OUTCOME MEASURES: Eval: Quick Dash: 27% impairment today   UE ROM   Eval: 1.3cm gap from pad of MF to [facility] today (but does have hx of trigger fingers and limited mobility at baseline in Lt hand). Lt thumb opposes 2cm gap to base of SF, but full opposition to pad of SF.    Active ROM Left 08/21/2021 Left  09/03/21  Shoulder flexion 144*   Shoulder abduction     Shoulder adduction  Shoulder extension     Shoulder internal rotation     Shoulder external rotation     Elbow flexion 150*   Elbow extension (-20*)    Wrist flexion 40 44*  Wrist extension 35 (63* in rt)  37*  Wrist pronation 80*   Wrist supination 80* pulls   (Blank rows = not tested)     UE MMT:      MMT Left TBD  Shoulder flexion    Shoulder abduction    Shoulder adduction    Shoulder extension    Shoulder internal rotation    Shoulder external rotation    Middle trapezius    Lower trapezius    Elbow flexion    Elbow extension    Wrist flexion    Wrist extension    Wrist ulnar deviation    Wrist radial deviation    Wrist pronation    Wrist supination     (Blank rows = not tested)   HAND FUNCTION: Grip strength: Right: 57 lbs; Left: TBD lbs   COORDINATION: 09/03/21:  Box and Blocks: Left 47 blocks (51 WFL)    SENSATION: LT intact in Left hand, some c/o about paresthesia in left neck/shoulder since fall.    EDEMA:  Eval: 19.9cm circumferentially around Lt MCP Js.  09/03/21: 19.65cm circumferentially around Lt MCP Js.    COGNITION: Overall cognitive status: Within functional limits for tasks assessed   OBSERVATIONS: 08/21/21: pt seems to have low pain, good finger motion, very stiff at wrist, but fairly good FA motion as well.      TODAY'S TREATMENT:  09/03/21: She performs AROM for exercise and HEP review looking only slightly more mobile today. She is also edu to start weaning from orthotic for 15 mins - 1 hour periods as tol in the day, wear orthotic is sore and needing to rest. She states understanding but also being somewhat forgetful and wants written directions as well.  She performs Box and Blocks functional activity showing fairly good GMS dexterity. OT also upgrades light stretches to now weighted stretches as tolerated in 6 planes with hammer or light 1-3# weight. She demo's bach back with hands-on assist and no added pain. Handouts were given/updated.     PATIENT EDUCATION: Education details: see tx section above for details  Person educated: Patient Education method: Explanation, Demonstration, Verbal cues, and Handouts Education comprehension: verbalized understanding, returned demonstration, verbal cues required, and needs further education     HOME EXERCISE PROGRAM: Access Code: [email or web address] URL: [email or web address] Prepared by: Benito Mccreedy       GOALS: Goals reviewed with patient? Yes   SHORT TERM GOALS: (STG required if POC>30 days)   Pt will obtain protective, custom orthotic. Target date: 08/21/21 Goal status: MET   2.  Pt will demo/state understanding of initial HEP to improve pain  levels and prerequisite motion. Target date: 08/30/21 Goal status: 09/03/21: MET though sometimes forgetful      LONG TERM GOALS:   Pt will improve functional ability by decreased impairment per Quick DASH assessment from 27% to 10% or better, for better quality of life. Target date: 10/16/21 Goal status: INITIAL   2.  Pt will improve grip strength in left hand to at least 40lbs for functional use at home and in IADLs. Target date: 10/16/21 Goal status: INITIAL   3.  Pt will improve A/ROM in left wrist flex and ext from 40/35 respectively to at least 55* each for functional motion for tasks like reach and grasp.  Target date: 10/16/21 Goal status: INITIAL   4.  Pt will improve strength in left wrist from 3-/5 MMT painful to at least 4+/5 MMT to have increased functional ability to carry out selfcare and higher-level homecare tasks with no difficulty. Target date: 10/16/21 Goal status: INITIAL       ASSESSMENT:   CLINICAL IMPRESSION: 09/03/21: OT somewhat concerned about stiffness in wrist PROM and will likely make dynamic wrist orthosis next session, if she hasn't significantly improved her mobility. (Stiffness could be due to current inflammation or lack of consistent HEP stretching)   08/27/21: She does have some fear and apprehension to stretch, and she states "forgetting" about fx at times, possible overuse to a slight degree. Pt requesting 1 visit a week for now, but if motion doesn't significantly improve OT will urge her to increase to 2x week as initially recommended.      PLAN: OT FREQUENCY: 2x/week   OT DURATION: 8 weeks   PLANNED INTERVENTIONS: self care/ADL training, therapeutic exercise, therapeutic activity, neuromuscular re-education, manual therapy, scar mobilization, splinting, electrical stimulation, ultrasound, moist heat, cryotherapy, contrast bath, patient/family education, coping strategies training, and DME and/or AE instructions   RECOMMENDED OTHER SERVICES:  none now    CONSULTED AND AGREED WITH PLAN OF CARE: Patient   PLAN FOR NEXT SESSION:  Start hand strength as tol, make dynamic wrist flexion / extension orthosis to improve tight motion / PROM as needed.    Benito Mccreedy, OTR/L, CHT 09/03/2021, 10:18 AM

## 2021-09-09 ENCOUNTER — Ambulatory Visit (INDEPENDENT_AMBULATORY_CARE_PROVIDER_SITE_OTHER): Payer: Medicare Other | Admitting: Rehabilitative and Restorative Service Providers"

## 2021-09-09 ENCOUNTER — Encounter: Payer: Self-pay | Admitting: Rehabilitative and Restorative Service Providers"

## 2021-09-09 DIAGNOSIS — M25532 Pain in left wrist: Secondary | ICD-10-CM

## 2021-09-09 DIAGNOSIS — M25632 Stiffness of left wrist, not elsewhere classified: Secondary | ICD-10-CM | POA: Diagnosis not present

## 2021-09-09 DIAGNOSIS — M25642 Stiffness of left hand, not elsewhere classified: Secondary | ICD-10-CM

## 2021-09-09 DIAGNOSIS — R278 Other lack of coordination: Secondary | ICD-10-CM | POA: Diagnosis not present

## 2021-09-09 DIAGNOSIS — R600 Localized edema: Secondary | ICD-10-CM

## 2021-09-09 DIAGNOSIS — M6281 Muscle weakness (generalized): Secondary | ICD-10-CM

## 2021-09-09 NOTE — Therapy (Signed)
OUTPATIENT OCCUPATIONAL THERAPY TREATMENT NOTE   Patient Name: Mercedes Briggs MRN: [id number] DOB:1951-01-24, 71 y.o., female Today's Date: 09/09/2021  PCP: Dr. Billey Chang REFERRING PROVIDER: Dr. Sherilyn Cooter   END OF SESSION:   OT End of Session - 09/09/21 0802     Visit Number 4    Number of Visits 14    Date for OT Re-Evaluation 10/16/21    Authorization Type Medicare & [employer]ARP    Progress Note Due on Visit 10    OT Start Time 0802    OT Stop Time 0847    OT Time Calculation (min) 45 min    Activity Tolerance Patient tolerated treatment well;No increased pain;Patient limited by pain    Behavior During Therapy [facility] for tasks assessed/performed               Past Medical History:  Diagnosis Date   Age related osteoporosis 11/28/2019   Dexa 11/2019: T = - 2.5 lumbar spine. Offer treatment.    Herniated disc, cervical    Hyperlipidemia    Hypertension    Hypothyroidism    Past Surgical History:  Procedure Laterality Date   ABDOMINAL HYSTERECTOMY     ANTERIOR CERVICAL DISCECTOMY     CESAREAN SECTION     OPEN REDUCTION INTERNAL FIXATION (ORIF) DISTAL RADIAL FRACTURE Left 08/05/2021   Procedure: LEFT OPEN REDUCTION INTERNAL FIXATION (ORIF) DISTAL RADIAL FRACTURE;  Surgeon: Sherilyn Cooter, MD;  Location: [place];  Service: Orthopedics;  Laterality: Left;   TONSILLECTOMY     Patient Active Problem List   Diagnosis Date Noted   Closed fracture of left distal radius 07/30/2021   Age related osteoporosis 11/28/2019   Refusal of statin medication by patient 09/26/2019   Acquired hypothyroidism 12/06/2015   White coat syndrome with diagnosis of hypertension 12/06/2015   History of fusion of cervical spine 12/06/2015   Mixed hyperlipidemia 12/06/2015    PCP: Dr. Billey Chang REFERRING PROVIDER: Dr. Sherilyn Cooter   THERAPY DIAG:  Pain in left wrist  Muscle weakness (generalized)  Other lack of coordination  Localized edema  Stiffness of left  hand, not elsewhere classified  Stiffness of left wrist, not elsewhere classified  Rationale for Evaluation and Treatment Rehabilitation  PERTINENT HISTORY: Per MD: "Left distal radius fracture s/p ORIF"    PRECAUTIONS: 5 weeks out from sx now, NWB in Left hand/wrist now, 5-6 weeks wrist PRE, joint mobs and wean from orthotics as possible   WEIGHT BEARING RESTRICTIONS Yes limit to 3-5# for next 4 weeks   SUBJECTIVE:  She states doing well with exercises and being cautious not to cause pain to herself at home.   PAIN:  Are you having pain? Not now at rest  Rating: 0/10 at rest now    OBJECTIVE: (All objective assessments below are from initial evaluation on: 08/21/21 unless otherwise specified.)   HAND DOMINANCE: Right   ADLs: Overall ADLs: Eval: Pain and problems doing home care, putting on socks, fasteners, etc.  09/09/21: She states now able to take off lids from bottles, etc.     FUNCTIONAL OUTCOME MEASURES: Eval: Quick Dash: 27% impairment today   UE ROM   Eval: 1.3cm gap from pad of MF to [facility] today (but does have hx of trigger fingers and limited mobility at baseline in Lt hand). Lt thumb opposes 2cm gap to base of SF, but full opposition to pad of SF.   09/09/21: She has full fist and full opposition to base of SF today.   Active  ROM Left 08/21/2021 Left  09/03/21 Left 09/09/21  Shoulder flexion 144*    Shoulder abduction      Shoulder adduction      Shoulder extension      Shoulder internal rotation      Shoulder external rotation      Elbow flexion 150*    Elbow extension (-20*)     Wrist flexion 40 44* 49*  Wrist extension 35 (63* in rt)  37* 43*  Wrist pronation 80*  80*  Wrist supination 80* pulls  90*  (Blank rows = not tested)     UE MMT:      MMT Left TBD  Shoulder flexion    Shoulder abduction    Shoulder adduction    Shoulder extension    Shoulder internal rotation    Shoulder external rotation    Middle trapezius    Lower trapezius    Elbow  flexion    Elbow extension    Wrist flexion    Wrist extension    Wrist ulnar deviation    Wrist radial deviation    Wrist pronation    Wrist supination    (Blank rows = not tested)   HAND FUNCTION: Eval: Grip strength: Right: 57 lbs; Left: TBD lbs  09/09/21: Grip: Left: 35.7 lbs   COORDINATION: 09/03/21:  Box and Blocks: Left 47 blocks (51 WFL)    SENSATION: LT intact in Left hand, some c/o about paresthesia in left neck/shoulder since fall.    EDEMA:  Eval: 19.9cm circumferentially around Lt MCP Js.  09/03/21: 19.65cm circumferentially around Lt MCP Js.  09/09/21: 19.6cm circumferentially around Lt MCP Js (this is less than right hand)    COGNITION: Overall cognitive status: Within functional limits for tasks assessed   OBSERVATIONS: 08/21/21: pt seems to have low pain, good finger motion, very stiff at wrist, but fairly good FA motion as well.      TODAY'S TREATMENT:  09/09/21: She tolerates gripping very well today, and hand strengthening activities with putty was added today in multiple ways (8 exercises as below). All exercises below were reviewed and performed with OT supervision. She also does AROM for exercise and new measures. Motion is improved in all planes, but not very much, so OT will plan on making a dynamic orthotic this week during next session. OT also adjusts her resting immobilization orthosis in a couple areas that was bothering her/ causing pressure.   Exercises - Wrist Prayer Stretch  - 3-4 x daily - 3-5 reps - 15 sec hold - Seated Wrist Flexion with Overpressure  - 3-4 x daily - 3-5 reps - 15 sec hold - ECCENTRIC HAMMER (SLOWLY LET DOWN)   - 2-3 x daily - 1-2 sets - 5-15 reps - Bend and Pull Back Wrist SLOWLY  - 3-4 x daily - 1-2 sets - 10-15 reps - "Windshield Wipers"   - 3-4 x daily - 1-2 sets - 10-15 reps - Wrist AROM Dart Throwers Motion  - 3-4 x daily - 1-2 sets - 10-15 reps - Seated Finger Composite Flexion Stretch  - 1-2 x daily - 3-5 reps - 15 hold -  Seated Thumb MP Flexion PROM  - 1-2 x daily - 1 sets - 10-15 reps - Tendon Glides  - 3-4 x daily - 3-5 reps - 2-3 seconds hold - Full Fist  - 2-3 x daily - 5 reps - Seated Claw Fist with Putty  - 2-3 x daily - 5 reps - "Duck Mouth" Strength  -   2-3 x daily - 5 reps - Finger Extension "Pizza!"   - 2-3 x daily - 5 reps - Thumb Press  - 2-3 x daily - 5 reps - Finger Key Grip with Putty  - 2-3 x daily - 5 reps - Thumb Opposition with Putty  - 2-3 x daily - 5 reps - Finger Pinch and Pull with Putty  - 2-3 x daily - 5 reps   09/03/21: She performs AROM for exercise and HEP review looking only slightly more mobile today. She is also edu to start weaning from orthotic for 15 mins - 1 hour periods as tol in the day, wear orthotic is sore and needing to rest. She states understanding but also being somewhat forgetful and wants written directions as well.  She performs Box and Blocks functional activity showing fairly good GMS dexterity. OT also upgrades light stretches to now weighted stretches as tolerated in 6 planes with hammer or light 1-3# weight. She demo's bach back with hands-on assist and no added pain. Handouts were given/updated.     PATIENT EDUCATION: Education details: see tx section above for details  Person educated: Patient Education method: Explanation, Demonstration, Verbal cues, and Handouts Education comprehension: verbalized understanding, returned demonstration, verbal cues required, and needs further education     HOME EXERCISE PROGRAM: Access Code: [email or web address] URL: [email or web address] Prepared by: Benito Mccreedy       GOALS: Goals reviewed with patient? Yes   SHORT TERM GOALS: (STG required if POC>30 days)   Pt will obtain protective, custom orthotic. Target date: 08/21/21 Goal status: MET   2.  Pt will demo/state understanding of initial HEP to improve pain levels and prerequisite motion. Target date: 08/30/21 Goal status: 09/03/21: MET though  sometimes forgetful      LONG TERM GOALS:   Pt will improve functional ability by decreased impairment per Quick DASH assessment from 27% to 10% or better, for better quality of life. Target date: 10/16/21 Goal status: INITIAL   2.  Pt will improve grip strength in left hand to at least 40lbs for functional use at home and in IADLs. Target date: 10/16/21 Goal status: INITIAL   3.  Pt will improve A/ROM in left wrist flex and ext from 40/35 respectively to at least 55* each for functional motion for tasks like reach and grasp.  Target date: 10/16/21 Goal status: INITIAL   4.  Pt will improve strength in left wrist from 3-/5 MMT painful to at least 4+/5 MMT to have increased functional ability to carry out selfcare and higher-level homecare tasks with no difficulty. Target date: 10/16/21 Goal status: INITIAL       ASSESSMENT:   CLINICAL IMPRESSION: 09/09/21: Motion is improved in all planes, but not very much, so OT will plan on making a dynamic orthotic this week during next session. Hand strength tolerated well so far.   09/03/21: OT somewhat concerned about stiffness in wrist PROM and will likely make dynamic wrist orthosis next session, if she hasn't significantly improved her mobility. (Stiffness could be due to current inflammation or lack of consistent HEP stretching)       PLAN: OT FREQUENCY: 2x/week   OT DURATION: 8 weeks   PLANNED INTERVENTIONS: self care/ADL training, therapeutic exercise, therapeutic activity, neuromuscular re-education, manual therapy, scar mobilization, splinting, electrical stimulation, ultrasound, moist heat, cryotherapy, contrast bath, patient/family education, coping strategies training, and DME and/or AE instructions   RECOMMENDED OTHER SERVICES: none now    CONSULTED AND AGREED WITH PLAN OF CARE: Patient  PLAN FOR NEXT SESSION:  Make dynamic wrist flex and ext orthosis to help improve PROM of wrist. Check HEP for new strength (putty) as well.  Next week start wrist PRE as tolerated.    Benito Mccreedy, OTR/L, CHT 09/09/2021, 3:16 PM

## 2021-09-12 ENCOUNTER — Ambulatory Visit (INDEPENDENT_AMBULATORY_CARE_PROVIDER_SITE_OTHER): Payer: Medicare Other | Admitting: Rehabilitative and Restorative Service Providers"

## 2021-09-12 ENCOUNTER — Encounter: Payer: Self-pay | Admitting: Rehabilitative and Restorative Service Providers"

## 2021-09-12 DIAGNOSIS — R600 Localized edema: Secondary | ICD-10-CM

## 2021-09-12 DIAGNOSIS — M6281 Muscle weakness (generalized): Secondary | ICD-10-CM | POA: Diagnosis not present

## 2021-09-12 DIAGNOSIS — R278 Other lack of coordination: Secondary | ICD-10-CM | POA: Diagnosis not present

## 2021-09-12 DIAGNOSIS — M25632 Stiffness of left wrist, not elsewhere classified: Secondary | ICD-10-CM | POA: Diagnosis not present

## 2021-09-12 DIAGNOSIS — M25642 Stiffness of left hand, not elsewhere classified: Secondary | ICD-10-CM

## 2021-09-12 DIAGNOSIS — M25532 Pain in left wrist: Secondary | ICD-10-CM | POA: Diagnosis not present

## 2021-09-12 NOTE — Therapy (Signed)
OUTPATIENT OCCUPATIONAL THERAPY TREATMENT NOTE   Patient Name: Mercedes Briggs MRN: [id number] DOB:1950/12/25, 71 y.o., female Today's Date: 09/12/2021  PCP: Dr. Billey Chang REFERRING PROVIDER: Dr. Sherilyn Cooter   END OF SESSION:   OT End of Session - 09/12/21 0935     Visit Number 5    Number of Visits 14    Date for OT Re-Evaluation 10/16/21    Authorization Type Medicare & AARP    Progress Note Due on Visit 10    OT Start Time 0935    OT Stop Time 1018    OT Time Calculation (min) 43 min    Activity Tolerance Patient tolerated treatment well;No increased pain;Patient limited by pain    Behavior During Therapy [facility] for tasks assessed/performed                Past Medical History:  Diagnosis Date   Age related osteoporosis 11/28/2019   Dexa 11/2019: T = - 2.5 lumbar spine. Offer treatment.    Herniated disc, cervical    Hyperlipidemia    Hypertension    Hypothyroidism    Past Surgical History:  Procedure Laterality Date   ABDOMINAL HYSTERECTOMY     ANTERIOR CERVICAL DISCECTOMY     CESAREAN SECTION     OPEN REDUCTION INTERNAL FIXATION (ORIF) DISTAL RADIAL FRACTURE Left 08/05/2021   Procedure: LEFT OPEN REDUCTION INTERNAL FIXATION (ORIF) DISTAL RADIAL FRACTURE;  Surgeon: Sherilyn Cooter, MD;  Location: [facility];  Service: Orthopedics;  Laterality: Left;   TONSILLECTOMY     Patient Active Problem List   Diagnosis Date Noted   Closed fracture of left distal radius 07/30/2021   Age related osteoporosis 11/28/2019   Refusal of statin medication by patient 09/26/2019   Acquired hypothyroidism 12/06/2015   White coat syndrome with diagnosis of hypertension 12/06/2015   History of fusion of cervical spine 12/06/2015   Mixed hyperlipidemia 12/06/2015    ONSET DATE: DOS 08/05/21   REFERRING DIAG: V77.939Q (ICD-10-CM) - Closed Barton's fracture of left radius, initial encounter  THERAPY DIAG:  Pain in left wrist  Muscle weakness (generalized)  Other  lack of coordination  Stiffness of left wrist, not elsewhere classified  Stiffness of left hand, not elsewhere classified  Localized edema  Rationale for Evaluation and Treatment Rehabilitation  PERTINENT HISTORY: Per MD: "Left distal radius fracture s/p ORIF"    PRECAUTIONS: 5+ weeks out from sx now, NWB in Left hand/wrist now, 5-6 weeks wrist PRE, joint mobs and wean from orthotics as possible   WEIGHT BEARING RESTRICTIONS Yes limit to 3-5# for next 4 weeks   SUBJECTIVE:  She states leaving off her orthotic frequently at home and arrives not wearing it.  She states trying to do light activities at home and not doing HEP as much as OT had recommended. OT reminds her that she was not yet told to take of brace for light activities and that she needs to be doing HEP consistently. This is not the first time that she was cautioned about these things.    PAIN:  Are you having pain? Not now at rest  Rating: 0/10 at rest now    OBJECTIVE: (All objective assessments below are from initial evaluation on: 08/21/21 unless otherwise specified.)   HAND DOMINANCE: Right   ADLs: Overall ADLs: Eval: Pain and problems doing home care, putting on socks, fasteners, etc.  09/09/21: She states now able to take off lids from bottles, etc.    FUNCTIONAL OUTCOME MEASURES: Eval: Quick Dash: 27% impairment today  UE ROM   Eval: 1.3cm gap from pad of MF to [facility] today (but does have hx of trigger fingers and limited mobility at baseline in Lt hand). Lt thumb opposes 2cm gap to base of SF, but full opposition to pad of SF.   09/09/21: She has full fist and full opposition to base of SF today.   Active ROM Left 08/21/2021 Left  09/03/21 Left 09/09/21 Left 09/12/21  Shoulder flexion 144*     Shoulder abduction       Shoulder adduction       Shoulder extension       Shoulder internal rotation       Shoulder external rotation       Elbow flexion 150*     Elbow extension (-20*)      Wrist flexion 40 44* 49*  44  (62* opposite side)   Wrist extension 35 (63* in rt)  37* 43* 47*  (65* opposite hand)  Wrist pronation 80*  80*   Wrist supination 80* pulls  90*   (Blank rows = not tested)     UE MMT:      MMT Left TBD  Elbow flexion    Elbow extension    Wrist flexion    Wrist extension    Wrist ulnar deviation    Wrist radial deviation    Wrist pronation    Wrist supination    (Blank rows = not tested)   HAND FUNCTION: Eval: Grip strength: Right: 57 lbs; Left: TBD lbs  09/09/21: Grip: Left: 35.7 lbs  09/12/21: Grip: Left: 32 lbs   COORDINATION: 09/03/21:  Box and Blocks: Left 47 blocks (51 WFL)    SENSATION: LT intact in Left hand, some c/o about paresthesia in left neck/shoulder since fall.    EDEMA:  Eval: 19.9cm circumferentially around Lt MCP Js.  09/03/21: 19.65cm circumferentially around Lt MCP Js.  09/09/21: 19.6cm circumferentially around Lt MCP Js (this is less than right hand)    COGNITION: Overall cognitive status: Within functional limits for tasks assessed   OBSERVATIONS: 08/21/21: pt seems to have low pain, good finger motion, very stiff at wrist, but fairly good FA motion as well.      TODAY'S TREATMENT:  09/12/21: Her orthotic is not fitting well again and OT thinks it may be due to her not wearing it, might have left it in the sun or heat, etc. OT adjusts it again today and it fits better.  OT also does give her permission now to leave it off while at home, unless lifting anything >5#, but to wear it still when leaving the home. OT then reviews her HEP with her and performs manual IASTM to dorsum of forearm, wrist, hand, followed by manual stretches at wrist in flexion which improves her flexion a few degrees after being done. She states not wanting a dynamic wrist orthosis fabricated and seems to have a small lack of motivation to get motion better at wrist.  (OT asks, "Are you satisfied with your current motion?" And she answers "Well, is it much worse than my other  side?") OT suggests she keeps doing HEP stretches until she reaches at least 50* motion in both flexion and extension to limit dysfunction. Hand strength activities with putty was reviewed as well, and she was given rubber band to do finger extension with, if she prefers that to putty method.    09/09/21: She tolerates gripping very well today, and hand strengthening activities with putty was added today  in multiple ways (8 exercises as below). All exercises below were reviewed and performed with OT supervision. She also does AROM for exercise and new measures. Motion is improved in all planes, but not very much, so OT will plan on making a dynamic orthotic this week during next session. OT also adjusts her resting immobilization orthosis in a couple areas that was bothering her/ causing pressure.   Exercises - Wrist Prayer Stretch  - 3-4 x daily - 3-5 reps - 15 sec hold - Seated Wrist Flexion with Overpressure  - 3-4 x daily - 3-5 reps - 15 sec hold - ECCENTRIC HAMMER (SLOWLY LET DOWN)   - 2-3 x daily - 1-2 sets - 5-15 reps - Bend and Pull Back Wrist SLOWLY  - 3-4 x daily - 1-2 sets - 10-15 reps - "Windshield Wipers"   - 3-4 x daily - 1-2 sets - 10-15 reps - Wrist AROM Dart Throwers Motion  - 3-4 x daily - 1-2 sets - 10-15 reps - Seated Finger Composite Flexion Stretch  - 1-2 x daily - 3-5 reps - 15 hold - Seated Thumb MP Flexion PROM  - 1-2 x daily - 1 sets - 10-15 reps - Tendon Glides  - 3-4 x daily - 3-5 reps - 2-3 seconds hold - Full Fist  - 2-3 x daily - 5 reps - Seated Claw Fist with Putty  - 2-3 x daily - 5 reps - "Duck Mouth" Strength  - 2-3 x daily - 5 reps - Finger Extension "Pizza!"   - 2-3 x daily - 5 reps - Thumb Press  - 2-3 x daily - 5 reps - Finger Key Grip with Putty  - 2-3 x daily - 5 reps - Thumb Opposition with Putty  - 2-3 x daily - 5 reps - Finger Pinch and Pull with Putty  - 2-3 x daily - 5 reps   PATIENT EDUCATION: Education details: see tx section above for details   Person educated: Patient Education method: Explanation, Demonstration, Verbal cues, and Handouts Education comprehension: verbalized understanding, returned demonstration, verbal cues required, and needs further education     HOME EXERCISE PROGRAM: Access Code: [email or web address] URL: [email or web address] Prepared by: Benito Mccreedy       GOALS: Goals reviewed with patient? Yes   SHORT TERM GOALS: (STG required if POC>30 days)   Pt will obtain protective, custom orthotic. Target date: 08/21/21 Goal status: MET   2.  Pt will demo/state understanding of initial HEP to improve pain levels and prerequisite motion. Target date: 08/30/21 Goal status: 09/03/21: MET though sometimes forgetful      LONG TERM GOALS:   Pt will improve functional ability by decreased impairment per Quick DASH assessment from 27% to 10% or better, for better quality of life. Target date: 10/16/21 Goal status: INITIAL   2.  Pt will improve grip strength in left hand to at least 40lbs for functional use at home and in IADLs. Target date: 10/16/21 Goal status: INITIAL   3.  Pt will improve A/ROM in left wrist flex and ext from 40/35 respectively to at least 55* each for functional motion for tasks like reach and grasp.  Target date: 10/16/21 Goal status: INITIAL   4.  Pt will improve strength in left wrist from 3-/5 MMT painful to at least 4+/5 MMT to have increased functional ability to carry out selfcare and higher-level homecare tasks with no difficulty. Target date: 10/16/21 Goal status: INITIAL       ASSESSMENT:  CLINICAL IMPRESSION: 09/12/21: She seems to show some forgetfulness again today, re-stating about taking off brace and lifting heavier objects, also not doing HEP as recommended. These things have been cautioned against in past 3 sessions at least. She also admits to not doing HEP as asked and makes comments about not being motivated to perform them often and being somewhat satisfied  with her current motion. She states that she doesn't want a dynamic brace made for her today to help her PROM. (She "wants to try another week [of stretching on her own] first.") These factors make prognosis to increase motion and strength somewhat poor at this point, especially considering the lack of progress with motion at fingers wrist and with grip strength. Goals may need re-evaluated.   09/09/21: Motion is improved in all planes, but not very much, so OT will plan on making a dynamic orthotic this week during next session. Hand strength tolerated well so far.   09/03/21: OT somewhat concerned about stiffness in wrist PROM and will likely make dynamic wrist orthosis next session, if she hasn't significantly improved her mobility. (Stiffness could be due to current inflammation or lack of consistent HEP stretching)      PLAN: OT FREQUENCY: 2x/week   OT DURATION: 8 weeks   PLANNED INTERVENTIONS: self care/ADL training, therapeutic exercise, therapeutic activity, neuromuscular re-education, manual therapy, scar mobilization, splinting, electrical stimulation, ultrasound, moist heat, cryotherapy, contrast bath, patient/family education, coping strategies training, and DME and/or AE instructions   RECOMMENDED OTHER SERVICES: none now    CONSULTED AND AGREED WITH PLAN OF CARE: Patient   PLAN FOR NEXT SESSION:  Add gentle wrist PRE as tolerated, per protocols, consider re-evaluating goals sooner, as her stated goals and expectations now don't seem to match what was discussed at initial evaluation. Encourage her to have dynamic wrist flex and ext orthosis made if still very stiff. Otherwise continue with modalities, manual therapy as indicated/tolerated and encourage HEP performance.    Benito Mccreedy, OTR/L, CHT 09/12/2021, 5:54 PM

## 2021-09-16 ENCOUNTER — Ambulatory Visit (INDEPENDENT_AMBULATORY_CARE_PROVIDER_SITE_OTHER): Payer: Medicare Other

## 2021-09-16 ENCOUNTER — Ambulatory Visit (INDEPENDENT_AMBULATORY_CARE_PROVIDER_SITE_OTHER): Payer: Medicare Other | Admitting: Orthopedic Surgery

## 2021-09-16 ENCOUNTER — Encounter: Payer: Self-pay | Admitting: Rehabilitative and Restorative Service Providers"

## 2021-09-16 ENCOUNTER — Ambulatory Visit (INDEPENDENT_AMBULATORY_CARE_PROVIDER_SITE_OTHER): Payer: Medicare Other | Admitting: Rehabilitative and Restorative Service Providers"

## 2021-09-16 DIAGNOSIS — S52562A Barton's fracture of left radius, initial encounter for closed fracture: Secondary | ICD-10-CM | POA: Diagnosis not present

## 2021-09-16 DIAGNOSIS — R278 Other lack of coordination: Secondary | ICD-10-CM | POA: Diagnosis not present

## 2021-09-16 DIAGNOSIS — M25532 Pain in left wrist: Secondary | ICD-10-CM

## 2021-09-16 DIAGNOSIS — M25632 Stiffness of left wrist, not elsewhere classified: Secondary | ICD-10-CM | POA: Diagnosis not present

## 2021-09-16 DIAGNOSIS — Z0000 Encounter for general adult medical examination without abnormal findings: Secondary | ICD-10-CM | POA: Diagnosis not present

## 2021-09-16 DIAGNOSIS — R600 Localized edema: Secondary | ICD-10-CM

## 2021-09-16 DIAGNOSIS — M6281 Muscle weakness (generalized): Secondary | ICD-10-CM

## 2021-09-16 DIAGNOSIS — M25642 Stiffness of left hand, not elsewhere classified: Secondary | ICD-10-CM

## 2021-09-16 NOTE — Progress Notes (Signed)
   Post-Op Visit Note   Patient: Mercedes Briggs           Date of Birth: May 13, 1950           MRN: [id number] Visit Date: 09/16/2021 PCP: Willow Ora, MD   Assessment & Plan:  Chief Complaint:  Chief Complaint  Patient presents with   Left Wrist - Follow-up, Fracture   Visit Diagnoses:  1. Closed Barton's fracture of left radius, initial encounter     Plan: Patient now 6 weeks s/p ORIF of a left distal radius fracture.  She is doing very well.  She has much improved ROM.  She has no pain.  She is able to make a strong fist.  She is working very hard with therapy.  Repeat x-rays today show no changed in hardware position or fracture alignment.  I can see her back again in another month or so.   Follow-Up Instructions: No follow-ups on file.   Orders:  Orders Placed This Encounter  Procedures   XR Wrist Complete Left   No orders of the defined types were placed in this encounter.   Imaging: No results found.  PMFS History: Patient Active Problem List   Diagnosis Date Noted   Closed fracture of left distal radius 07/30/2021   Age related osteoporosis 11/28/2019   Refusal of statin medication by patient 09/26/2019   Acquired hypothyroidism 12/06/2015   White coat syndrome with diagnosis of hypertension 12/06/2015   History of fusion of cervical spine 12/06/2015   Mixed hyperlipidemia 12/06/2015   Past Medical History:  Diagnosis Date   Age related osteoporosis 11/28/2019   Dexa 11/2019: T = - 2.5 lumbar spine. Offer treatment.    Herniated disc, cervical    Hyperlipidemia    Hypertension    Hypothyroidism     Family History  Problem Relation Age of Onset   Alzheimer's disease Mother    Arthritis Mother    Atrial fibrillation Mother    Dementia Mother    Hypertension Mother    Asthma Father    COPD Father    Heart disease Father    Hypertension Father    Diabetes Brother    Neurodegenerative disease Brother        storage cell disease     Past Surgical History:  Procedure Laterality Date   ABDOMINAL HYSTERECTOMY     ANTERIOR CERVICAL DISCECTOMY     CESAREAN SECTION     OPEN REDUCTION INTERNAL FIXATION (ORIF) DISTAL RADIAL FRACTURE Left 08/05/2021   Procedure: LEFT OPEN REDUCTION INTERNAL FIXATION (ORIF) DISTAL RADIAL FRACTURE;  Surgeon: Marlyne Beards, MD;  Location: [facility];  Service: Orthopedics;  Laterality: Left;   TONSILLECTOMY     Social History   Occupational History   Occupation: Retired     Comment: Doctor, general practice   Tobacco Use   Smoking status: Never   Smokeless tobacco: Never   Tobacco comments:    occasionally   Vaping Use   Vaping Use: Never used  Substance and Sexual Activity   Alcohol use: Yes    Comment: socially   Drug use: Never   Sexual activity: Yes    Birth control/protection: Post-menopausal

## 2021-09-16 NOTE — Patient Instructions (Signed)
Mercedes Briggs , Thank you for taking time to come for your Medicare Wellness Visit. I appreciate your ongoing commitment to your health goals. Please review the following plan we discussed and let me know if I can assist you in the future.   Screening recommendations/referrals: Colonoscopy: Cologuard completed 08/12/19 repeat every 3 years  Mammogram: Done 01/16/21 repeat every year  Bone Density: Done 11/23/19 repeat every 2 years  Recommended yearly ophthalmology/optometry visit for glaucoma screening and checkup Recommended yearly dental visit for hygiene and checkup  Vaccinations: Influenza vaccine: Done 01/29/21 repeat every year  Pneumococcal vaccine: Up to date Tdap vaccine: Not a candidate  Shingles vaccine: Shingrix discussed. Please contact your pharmacy for coverage information.    Covid-19:Completed 2/12, 3/12, & 02/07/20  Advanced directives: copies in chart   Conditions/risks identified: be more careful   Next appointment: Follow up in one year for your annual wellness visit    Preventive Care 65 Years and Older, Female Preventive care refers to lifestyle choices and visits with your health care provider that can promote health and wellness. What does preventive care include? A yearly physical exam. This is also called an annual well check. Dental exams once or twice a year. Routine eye exams. Ask your health care provider how often you should have your eyes checked. Personal lifestyle choices, including: Daily care of your teeth and gums. Regular physical activity. Eating a healthy diet. Avoiding tobacco and drug use. Limiting alcohol use. Practicing safe sex. Taking low-dose aspirin every day. Taking vitamin and mineral supplements as recommended by your health care provider. What happens during an annual well check? The services and screenings done by your health care provider during your annual well check will depend on your age, overall health, lifestyle  risk factors, and family history of disease. Counseling  Your health care provider may ask you questions about your: Alcohol use. Tobacco use. Drug use. Emotional well-being. Home and relationship well-being. Sexual activity. Eating habits. History of falls. Memory and ability to understand (cognition). Work and work Astronomer. Reproductive health. Screening  You may have the following tests or measurements: Height, weight, and BMI. Blood pressure. Lipid and cholesterol levels. These may be checked every 5 years, or more frequently if you are over 71 years old. Skin check. Lung cancer screening. You may have this screening every year starting at age 71 if you have a 30-pack-year history of smoking and currently smoke or have quit within the past 15 years. Fecal occult blood test (FOBT) of the stool. You may have this test every year starting at age 71. Flexible sigmoidoscopy or colonoscopy. You may have a sigmoidoscopy every 5 years or a colonoscopy every 10 years starting at age 71. Hepatitis C blood test. Hepatitis B blood test. Sexually transmitted disease (STD) testing. Diabetes screening. This is done by checking your blood sugar (glucose) after you have not eaten for a while (fasting). You may have this done every 1-3 years. Bone density scan. This is done to screen for osteoporosis. You may have this done starting at age 71. Mammogram. This may be done every 1-2 years. Talk to your health care provider about how often you should have regular mammograms. Talk with your health care provider about your test results, treatment options, and if necessary, the need for more tests. Vaccines  Your health care provider may recommend certain vaccines, such as: Influenza vaccine. This is recommended every year. Tetanus, diphtheria, and acellular pertussis (Tdap, Td) vaccine. You may need a Td booster  every 10 years. Zoster vaccine. You may need this after age 66. Pneumococcal  13-valent conjugate (PCV13) vaccine. One dose is recommended after age 71. Pneumococcal polysaccharide (PPSV23) vaccine. One dose is recommended after age 71. Talk to your health care provider about which screenings and vaccines you need and how often you need them. This information is not intended to replace advice given to you by your health care provider. Make sure you discuss any questions you have with your health care provider. Document Released: 04/20/2015 Document Revised: 12/12/2015 Document Reviewed: 01/23/2015 Elsevier Interactive Patient Education  2017 [place] Prevention in the Home Falls can cause injuries. They can happen to people of all ages. There are many things you can do to make your home safe and to help prevent falls. What can I do on the outside of my home? Regularly fix the edges of walkways and driveways and fix any cracks. Remove anything that might make you trip as you walk through a door, such as a raised step or threshold. Trim any bushes or trees on the path to your home. Use bright outdoor lighting. Clear any walking paths of anything that might make someone trip, such as rocks or tools. Regularly check to see if handrails are loose or broken. Make sure that both sides of any steps have handrails. Any raised decks and porches should have guardrails on the edges. Have any leaves, snow, or ice cleared regularly. Use sand or salt on walking paths during winter. Clean up any spills in your garage right away. This includes oil or grease spills. What can I do in the bathroom? Use night lights. Install grab bars by the toilet and in the tub and shower. Do not use towel bars as grab bars. Use non-skid mats or decals in the tub or shower. If you need to sit down in the shower, use a plastic, non-slip stool. Keep the floor dry. Clean up any water that spills on the floor as soon as it happens. Remove soap buildup in the tub or shower regularly. Attach  bath mats securely with double-sided non-slip rug tape. Do not have throw rugs and other things on the floor that can make you trip. What can I do in the bedroom? Use night lights. Make sure that you have a light by your bed that is easy to reach. Do not use any sheets or blankets that are too big for your bed. They should not hang down onto the floor. Have a firm chair that has side arms. You can use this for support while you get dressed. Do not have throw rugs and other things on the floor that can make you trip. What can I do in the kitchen? Clean up any spills right away. Avoid walking on wet floors. Keep items that you use a lot in easy-to-reach places. If you need to reach something above you, use a strong step stool that has a grab bar. Keep electrical cords out of the way. Do not use floor polish or wax that makes floors slippery. If you must use wax, use non-skid floor wax. Do not have throw rugs and other things on the floor that can make you trip. What can I do with my stairs? Do not leave any items on the stairs. Make sure that there are handrails on both sides of the stairs and use them. Fix handrails that are broken or loose. Make sure that handrails are as long as the stairways. Check any carpeting to  make sure that it is firmly attached to the stairs. Fix any carpet that is loose or worn. Avoid having throw rugs at the top or bottom of the stairs. If you do have throw rugs, attach them to the floor with carpet tape. Make sure that you have a light switch at the top of the stairs and the bottom of the stairs. If you do not have them, ask someone to add them for you. What else can I do to help prevent falls? Wear shoes that: Do not have high heels. Have rubber bottoms. Are comfortable and fit you well. Are closed at the toe. Do not wear sandals. If you use a stepladder: Make sure that it is fully opened. Do not climb a closed stepladder. Make sure that both sides of the  stepladder are locked into place. Ask someone to hold it for you, if possible. Clearly mark and make sure that you can see: Any grab bars or handrails. First and last steps. Where the edge of each step is. Use tools that help you move around (mobility aids) if they are needed. These include: Canes. Walkers. Scooters. Crutches. Turn on the lights when you go into a dark area. Replace any light bulbs as soon as they burn out. Set up your furniture so you have a clear path. Avoid moving your furniture around. If any of your floors are uneven, fix them. If there are any pets around you, be aware of where they are. Review your medicines with your doctor. Some medicines can make you feel dizzy. This can increase your chance of falling. Ask your doctor what other things that you can do to help prevent falls. This information is not intended to replace advice given to you by your health care provider. Make sure you discuss any questions you have with your health care provider. Document Released: 01/18/2009 Document Revised: 08/30/2015 Document Reviewed: 04/28/2014 Elsevier Interactive Patient Education  2017 [employer].

## 2021-09-16 NOTE — Therapy (Signed)
OUTPATIENT OCCUPATIONAL THERAPY TREATMENT NOTE   Patient Name: Mercedes Briggs MRN: [id number] DOB:1950/05/15, 71 y.o., female Today's Date: 09/16/2021  PCP: Dr. Billey Chang REFERRING PROVIDER: Dr. Sherilyn Cooter   END OF SESSION:   OT End of Session - 09/16/21 0929     Visit Number 6    Number of Visits 14    Date for OT Re-Evaluation 10/16/21    Authorization Type Medicare & AARP    Progress Note Due on Visit 10    OT Start Time [phone]    OT Stop Time 1014    OT Time Calculation (min) 45 min    Activity Tolerance Patient tolerated treatment well;No increased pain;Patient limited by pain    Behavior During Therapy [facility] for tasks assessed/performed              Past Medical History:  Diagnosis Date   Age related osteoporosis 11/28/2019   Dexa 11/2019: T = - 2.5 lumbar spine. Offer treatment.    Herniated disc, cervical    Hyperlipidemia    Hypertension    Hypothyroidism    Past Surgical History:  Procedure Laterality Date   ABDOMINAL HYSTERECTOMY     ANTERIOR CERVICAL DISCECTOMY     CESAREAN SECTION     OPEN REDUCTION INTERNAL FIXATION (ORIF) DISTAL RADIAL FRACTURE Left 08/05/2021   Procedure: LEFT OPEN REDUCTION INTERNAL FIXATION (ORIF) DISTAL RADIAL FRACTURE;  Surgeon: Sherilyn Cooter, MD;  Location: [place];  Service: Orthopedics;  Laterality: Left;   TONSILLECTOMY     Patient Active Problem List   Diagnosis Date Noted   Closed fracture of left distal radius 07/30/2021   Age related osteoporosis 11/28/2019   Refusal of statin medication by patient 09/26/2019   Acquired hypothyroidism 12/06/2015   White coat syndrome with diagnosis of hypertension 12/06/2015   History of fusion of cervical spine 12/06/2015   Mixed hyperlipidemia 12/06/2015    ONSET DATE: DOS 08/05/21   REFERRING DIAG: X52.841L (ICD-10-CM) - Closed Barton's fracture of left radius, initial encounter  THERAPY DIAG:  Pain in left wrist  Stiffness of left wrist, not elsewhere  classified  Muscle weakness (generalized)  Other lack of coordination  Stiffness of left hand, not elsewhere classified  Localized edema  Rationale for Evaluation and Treatment Rehabilitation  PERTINENT HISTORY: Per MD: "Left distal radius fracture s/p ORIF"    PRECAUTIONS: 6 weeks out from sx now, NWB in Left hand/wrist now, 5-6 weeks wrist PRE, joint mobs and wean from orthotics as possible   WEIGHT BEARING RESTRICTIONS Yes limit to 3-5#   SUBJECTIVE:  She states working on HEP a lot over the weekend and watching some dogs.    PAIN:  Are you having pain? Not now at rest  Rating: 0/10 at rest now    OBJECTIVE: (All objective assessments below are from initial evaluation on: 08/21/21 unless otherwise specified.)   HAND DOMINANCE: Right   ADLs: Overall ADLs: Eval: Pain and problems doing home care, putting on socks, fasteners, etc.  09/09/21: She states now able to take off lids from bottles, etc.    FUNCTIONAL OUTCOME MEASURES: Eval: Quick Dash: 27% impairment today   UE ROM   Eval: 1.3cm gap from pad of MF to [facility] today (but does have hx of trigger fingers and limited mobility at baseline in Lt hand). Lt thumb opposes 2cm gap to base of SF, but full opposition to pad of SF.   09/09/21: She has full fist and full opposition to base of SF today.  Active ROM Left 08/21/2021 Left  09/03/21 Left 09/09/21 Left 09/12/21 Left  09/16/21  Shoulder flexion 144*      Shoulder abduction        Shoulder adduction        Shoulder extension        Shoulder internal rotation        Shoulder external rotation        Elbow flexion 150*      Elbow extension (-20*)       Wrist flexion 40 44* 49* 44  (62* opposite side)  50  Wrist extension 35 (63* in rt)  37* 43* 47*  (65* opposite hand) 46  Wrist pronation 80*  80*    Wrist supination 80* pulls  90*    (Blank rows = not tested)     UE MMT:      MMT Left TBD  Elbow flexion    Elbow extension    Wrist flexion    Wrist extension     Wrist ulnar deviation    Wrist radial deviation    Wrist pronation    Wrist supination    (Blank rows = not tested)   HAND FUNCTION: Eval: Grip strength: Right: 57 lbs; Left: TBD lbs  09/09/21: Grip: Left: 35.7 lbs  09/12/21: Grip: Left: 32 lbs  09/16/21: 38#   COORDINATION: 09/03/21:  Box and Blocks: Left 47 blocks (51 WFL)    SENSATION: LT intact in Left hand, some c/o about paresthesia in left neck/shoulder since fall.    EDEMA:  Eval: 19.9cm circumferentially around Lt MCP Js.  09/03/21: 19.65cm circumferentially around Lt MCP Js.  09/09/21: 19.6cm circumferentially around Lt MCP Js (this is less than right hand)    COGNITION: Overall cognitive status: Within functional limits for tasks assessed   OBSERVATIONS: 08/21/21: pt seems to have low pain, good finger motion, very stiff at wrist, but fairly good FA motion as well.      TODAY'S TREATMENT:  09/16/21: She performs grip, AROM and strength against resistance for new measures and exercises/activities, new wrist weighted PRE with 2# added (in wrist flex, ext, and pro/supination) after manual therapy stretches to wrist and MF in various wrist positions as she tolerates (moderate stretches today) also light joint mobilizations at her wrist. She is advised to leave off orthotic at home now, unless lifting heavy, wear when out of house for 1-2 more weeks, continue to avoid heavy/painful lifting (<10# now). She states understanding.   09/12/21: Her orthotic is not fitting well again and OT thinks it may be due to her not wearing it, might have left it in the sun or heat, etc. OT adjusts it again today and it fits better.  OT also does give her permission now to leave it off while at home, unless lifting anything >5#, but to wear it still when leaving the home. OT then reviews her HEP with her and performs manual IASTM to dorsum of forearm, wrist, hand, followed by manual stretches at wrist in flexion which improves her flexion a few degrees  after being done. She states not wanting a dynamic wrist orthosis fabricated and seems to have a small lack of motivation to get motion better at wrist.  (OT asks, "Are you satisfied with your current motion?" And she answers "Well, is it much worse than my other side?") OT suggests she keeps doing HEP stretches until she reaches at least 50* motion in both flexion and extension to limit dysfunction. Hand strength activities with  putty was reviewed as well, and she was given rubber band to do finger extension with, if she prefers that to putty method.    PATIENT EDUCATION: Education details: see tx section above for details  Person educated: Patient Education method: Explanation, Demonstration, Verbal cues, and Handouts Education comprehension: verbalized understanding, returned demonstration, verbal cues required, and needs further education     HOME EXERCISE PROGRAM: Access Code: [email or web address] URL: [email or web address] Prepared by: Benito Mccreedy     GOALS: Goals reviewed with patient? Yes   SHORT TERM GOALS: (STG required if POC>30 days)   Pt will obtain protective, custom orthotic. Target date: 08/21/21 Goal status: MET   2.  Pt will demo/state understanding of initial HEP to improve pain levels and prerequisite motion. Target date: 08/30/21 Goal status: 09/03/21: MET though sometimes forgetful      LONG TERM GOALS:   Pt will improve functional ability by decreased impairment per Quick DASH assessment from 27% to 10% or better, for better quality of life. Target date: 10/16/21 Goal status: INITIAL   2.  Pt will improve grip strength in left hand to at least 40lbs for functional use at home and in IADLs. Target date: 10/16/21 Goal status: INITIAL   3.  Pt will improve A/ROM in left wrist flex and ext from 40/35 respectively to at least 55* each for functional motion for tasks like reach and grasp.  Target date: 10/16/21 Goal status: INITIAL   4.  Pt will improve  strength in left wrist from 3-/5 MMT painful to at least 4+/5 MMT to have increased functional ability to carry out selfcare and higher-level homecare tasks with no difficulty. Target date: 10/16/21 Goal status: INITIAL       ASSESSMENT:   CLINICAL IMPRESSION: 09/16/21: She is doing a bit better, tolerating weight now. Is asked to think of any specific functional problems going on at home or in the community. Will need a reassessment in next 3-4 visits at least   09/12/21: She seems to show some forgetfulness again today, re-stating about taking off brace and lifting heavier objects, also not doing HEP as recommended. These things have been cautioned against in past 3 sessions at least. She also admits to not doing HEP as asked and makes comments about not being motivated to perform them often and being somewhat satisfied with her current motion. She states that she doesn't want a dynamic brace made for her today to help her PROM. (She "wants to try another week [of stretching on her own] first.") These factors make prognosis to increase motion and strength somewhat poor at this point, especially considering the lack of progress with motion at fingers wrist and with grip strength. Goals may need re-evaluated.     PLAN: OT FREQUENCY: 2x/week   OT DURATION: 8 weeks   PLANNED INTERVENTIONS: self care/ADL training, therapeutic exercise, therapeutic activity, neuromuscular re-education, manual therapy, scar mobilization, splinting, electrical stimulation, ultrasound, moist heat, cryotherapy, contrast bath, patient/family education, coping strategies training, and DME and/or AE instructions   RECOMMENDED OTHER SERVICES: none now    CONSULTED AND AGREED WITH PLAN OF CARE: Patient   PLAN FOR NEXT SESSION:  Check motion, strength, functional activity and tolerance. Progress as tolerated, using caution until at least 3 months post-op   Benito Mccreedy, OTR/L, CHT 09/16/2021, 1:17 PM

## 2021-09-16 NOTE — Progress Notes (Signed)
Virtual Visit via Telephone Note  I connected with  Mercedes Briggs on 09/16/21 at  8:00 AM EDT by telephone and verified that I am speaking with the correct person using two identifiers.  Medicare Annual Wellness visit completed telephonically due to Covid-19 pandemic.   Persons participating in this call: This Health Coach and this patient.   Location: Patient: home Provider: office   I discussed the limitations, risks, security and privacy concerns of performing an evaluation and management service by telephone and the availability of in person appointments. The patient expressed understanding and agreed to proceed.  Unable to perform video visit due to video visit attempted and failed and/or patient does not have video capability.   Some vital signs may be absent or patient reported.   [name], LPN   Subjective:   Mercedes Briggs is a 71 y.o. female who presents for Medicare Annual (Subsequent) preventive examination.  Review of Systems     Cardiac Risk Factors include: advanced age (>46men, >49 women);dyslipidemia;hypertension     Objective:    There were no vitals filed for this visit. There is no height or weight on file to calculate BMI.     09/16/2021    8:10 AM 08/21/2021    1:17 PM 08/05/2021   12:18 PM 07/29/2021   11:44 AM 07/21/2019    8:42 AM  Advanced Directives  Does Patient Have a Medical Advance Directive? Yes Yes Yes Yes Yes  Type of Estate agent of [employer] Power of [place];Living will Healthcare Power of ;Living will Living will;Healthcare Power of Attorney  Does patient want to make changes to medical advance directive?   No - Patient declined  No - Patient declined  Copy of Healthcare Power of Attorney in Chart? Yes - validated most recent copy scanned in chart (See row information) No - copy requested No - copy requested  No - copy requested    Current Medications  (verified) Outpatient Encounter Medications as of 09/16/2021  Medication Sig   Ascorbic Acid (VITAMIN C) 1000 MG tablet Take 1,000 mg by mouth daily. With a meal (lunch or supper)   ASHWAGANDHA PO Take 1 capsule by mouth daily as needed (with stress (anxiety relief)).   B Complex-C (B-COMPLEX WITH VITAMIN C) tablet Take 1 tablet by mouth daily. With a meal (lunch or supper)   Cholecalciferol (VITAMIN D-3) 125 MCG (5000 UT) TABS Take 5,000 Units by mouth daily. With a meal (lunch or supper)   Coenzyme Q-10 100 MG capsule Take 100 mg by mouth daily. With a meal (lunch or supper)   [employer]S TRIPLE MAGNESIUM COMPLEX PO Take 2 capsules by mouth daily.   ibuprofen (ADVIL) 200 MG tablet Take 600 mg by mouth every 8 (eight) hours as needed (for fracture pain.).   levothyroxine (SYNTHROID) 100 MCG tablet TAKE 1 TABLET BY MOUTH EVERY MORNING BEFORE BREAKFAST   lisinopril (ZESTRIL) 5 MG tablet Take 1 tablet (5 mg total) by mouth daily.   Menaquinone-7 (VITAMIN K2) 100 MCG CAPS Take 100 mcg by mouth daily. With a meal (lunch or supper)   Milk Thistle 1000 MG CAPS Take 1,000 mg by mouth daily. With a meal (lunch or supper)   Omega-3 Fatty Acids (FISH OIL) 1000 MG CAPS Take 2,000 mg by mouth daily. With a meal (lunch or supper)   [DISCONTINUED] aspirin 325 MG tablet Take 325-650 mg by mouth every 6 (six) hours as needed for headache. (Patient not taking: Reported on 08/21/2021)  No facility-administered encounter medications on file as of 09/16/2021.    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 Offer treatment.    Herniated disc, cervical    Hyperlipidemia    Hypertension    Hypothyroidism    Past Surgical History:  Procedure Laterality Date   ABDOMINAL HYSTERECTOMY     ANTERIOR CERVICAL DISCECTOMY     CESAREAN SECTION     OPEN REDUCTION INTERNAL FIXATION (ORIF) DISTAL RADIAL  FRACTURE Left 08/05/2021   Procedure: LEFT OPEN REDUCTION INTERNAL FIXATION (ORIF) DISTAL RADIAL FRACTURE;  Surgeon: Marlyne Beards, MD;  Location: [facility];  Service: Orthopedics;  Laterality: Left;   TONSILLECTOMY     Family History  Problem Relation Age of Onset   Alzheimer's disease Mother    Arthritis Mother    Atrial fibrillation Mother    Dementia Mother    Hypertension Mother    Asthma Father    COPD Father    Heart disease Father    Hypertension Father    Diabetes Brother    Neurodegenerative disease Brother        storage cell disease   Social History   Socioeconomic History   Marital status: Married    Spouse name: Not on file   Number of children: Not on file   Years of education: Not on file   Highest education level: Not on file  Occupational History   Occupation: Retired     Comment: Doctor, general practice   Tobacco Use   Smoking status: Never   Smokeless tobacco: Never   Tobacco comments:    occasionally   Vaping Use   Vaping Use: Never used  Substance and Sexual Activity   Alcohol use: Yes    Comment: socially   Drug use: Never   Sexual activity: Yes    Birth control/protection: Post-menopausal  Other Topics Concern   Not on file  Social History Narrative   1 daughter that lives in [place]    Social Determinants of Health   Financial Resource Strain: Low Risk  (09/16/2021)   Overall Financial Resource Strain (CARDIA)    Difficulty of Paying Living Expenses: Not hard at all  Food Insecurity: No Food Insecurity (09/16/2021)   Hunger Vital Sign    Worried About Running Out of Food in the Last Year: Never true    Ran Out of Food in the Last Year: Never true  Transportation Needs: No Transportation Needs (09/16/2021)   PRAPARE - Administrator, Civil Service (Medical): No    Lack of Transportation (Non-Medical): No  Physical Activity: Sufficiently Active (09/16/2021)   Exercise Vital Sign    Days of Exercise per Week: 5 days    Minutes of  Exercise per Session: 50 min  Stress: No Stress Concern Present (09/16/2021)   [employer] of Occupational Health - Occupational Stress Questionnaire    Feeling of Stress : Not at all  Social Connections: Moderately Integrated (09/16/2021)   Social Connection and Isolation Panel [NHANES]    Frequency of Communication with Friends and Family: More than three times a week    Frequency of Social Gatherings with Friends and Family: More than three times a week    Attends Religious Services: Never    Database administrator or Organizations: Yes    Attends Banker Meetings: 1 to 4 times per year    Marital  Status: Married    Tobacco Counseling Counseling given: Not Answered Tobacco comments: occasionally    Clinical Intake:  Pre-visit preparation completed: Yes  Pain : No/denies pain     BMI - recorded: 23.16 Nutritional Status: BMI of 19-24  Normal Nutritional Risks: None Diabetes: No  How often do you need to have someone help you when you read instructions, pamphlets, or other written materials from your doctor or pharmacy?: 1 - Never  Diabetic?no  Interpreter Needed?: No  Information entered by :: Mercedes Ensign, LPN   Activities of Daily Living    09/16/2021    8:13 AM 08/05/2021    1:05 PM  In your present state of health, do you have any difficulty performing the following activities:  Hearing? 0 0  Vision? 0 0  Difficulty concentrating or making decisions? 0 0  Walking or climbing stairs? 0 0  Dressing or bathing? 0 0  Doing errands, shopping? 0   Preparing Food and eating ? N   Using the Toilet? N   In the past six months, have you accidently leaked urine? Y   Comment urgency incontinience wears a liner   Do you have problems with loss of bowel control? N   Managing your Medications? N   Managing your Finances? N   Housekeeping or managing your Housekeeping? N     Patient Care Team: Willow Ora, MD as PCP - General (Family  Medicine) Annette Stable, OD (Inactive) as Consulting Physician (Optometry)  Indicate any recent Medical Services you may have received from other than [facility] providers in the past year (date may be approximate).     Assessment:   This is a routine wellness examination for Mercedes Briggs.  Hearing/Vision screen Hearing Screening - Comments:: Pt denies any hearing issues  Vision Screening - Comments:: Pt follows up with [facility]   Dietary issues and exercise activities discussed: Current Exercise Habits: Home exercise routine, Type of exercise: walking, Time (Minutes): 50, Frequency (Times/Week): 5, Weekly Exercise (Minutes/Week): 250   Goals Addressed             This Visit's Progress    Patient Stated       Be more careful       Depression Screen    09/16/2021    8:09 AM 08/27/2020    1:57 PM 07/21/2019    8:44 AM 12/20/2018   10:31 AM 12/14/2017    1:57 PM  PHQ 2/9 Scores  PHQ - 2 Score 0 1 0 0 0    Fall Risk    09/16/2021    8:12 AM 08/27/2020    2:01 PM 07/21/2019    8:44 AM 12/20/2018   10:31 AM 12/14/2017    1:57 PM  Fall Risk   Falls in the past year? 1 0 0 0 No  Number falls in past yr: 1 0 0 0   Injury with Fall? 1 0 0 0   Comment related to raised side walk, left shoulder , hand      Risk for fall due to : Impaired vision;Impaired balance/gait Impaired vision     Follow up Falls prevention discussed Falls prevention discussed Falls evaluation completed;Education provided;Falls prevention discussed Falls evaluation completed     FALL RISK PREVENTION PERTAINING TO THE HOME:  Any stairs in or around the home? No  If so, are there any without handrails? No  Home free of loose throw rugs in walkways, pet beds, electrical cords, etc? Yes  Adequate lighting  in your home to reduce risk of falls? Yes   ASSISTIVE DEVICES UTILIZED TO PREVENT FALLS:  Life alert? No  Use of a cane, walker or w/c? No  Grab bars in the bathroom? Yes  Shower chair or bench in shower? Yes   Elevated toilet seat or a handicapped toilet? Yes   TIMED UP AND GO:  Was the test performed? No .   Cognitive Function:        08/27/2020    2:03 PM 07/21/2019    8:43 AM  6CIT Screen  What Year? 0 points 0 points  What month? 0 points 0 points  What time?  0 points  Count back from 20 0 points 0 points  Months in reverse 0 points 0 points  Repeat phrase 0 points 0 points  Total Score  0 points    Immunizations Immunization History  Administered Date(s) Administered   Influenza, High Dose Seasonal PF 04/19/2016   Moderna Sars-Covid-2 Vaccination 05/20/2019, 06/17/2019, 02/07/2020   Pneumococcal Conjugate-13 04/19/2016   Pneumococcal Polysaccharide-23 12/14/2017   Zoster, Live 12/06/2015      Flu Vaccine status: Up to date  Pneumococcal vaccine status: Up to date  Covid-19 vaccine status: Completed vaccines  Qualifies for Shingles Vaccine? Yes   Zostavax completed No   Shingrix Completed?: No.    Education has been provided regarding the importance of this vaccine. Patient has been advised to call insurance company to determine out of pocket expense if they have not yet received this vaccine. Advised may also receive vaccine at local pharmacy or Health Dept. Verbalized acceptance and understanding.  Screening Tests Health Maintenance  Topic Date Due   Zoster Vaccines- Shingrix (1 of 2) Never done   COVID-19 Vaccine (4 - Moderna series) 04/03/2020   INFLUENZA VACCINE  11/05/2021   DEXA SCAN  11/22/2021   MAMMOGRAM  01/16/2022   Fecal DNA (Cologuard)  08/12/2022   Pneumonia Vaccine 63+ Years old  Completed   Hepatitis C Screening  Completed   HPV VACCINES  Aged Out    Health Maintenance  Health Maintenance Due  Topic Date Due   Zoster Vaccines- Shingrix (1 of 2) Never done   COVID-19 Vaccine (4 - Moderna series) 04/03/2020    Colorectal cancer screening: Type of screening: Cologuard. Completed 08/12/19. Repeat every 3 years  Mammogram status: Completed  01/16/21. Repeat every year  Bone Density status: Completed 11/23/19. Results reflect: Bone density results: OSTEOPOROSIS. Repeat every 2 years.   Additional Screening:  Hepatitis C Screening:  Completed 09/26/19  Vision Screening: Recommended annual ophthalmology exams for early detection of glaucoma and other disorders of the eye. Is the patient up to date with their annual eye exam?  Yes  Who is the provider or what is the name of the office in which the patient attends annual eye exams? [facility]  If pt is not established with a provider, would they like to be referred to a provider to establish care? No .   Dental Screening: Recommended annual dental exams for proper oral hygiene  Community Resource Referral / Chronic Care Management: CRR required this visit?  No   CCM required this visit?  No      Plan:     I have personally reviewed and noted the following in the patient's chart:   Medical and social history Use of alcohol, tobacco or illicit drugs  Current medications and supplements including opioid prescriptions.  Functional ability and status Nutritional status Physical activity Advanced directives  List of other physicians Hospitalizations, surgeries, and ER visits in previous 12 months Vitals Screenings to include cognitive, depression, and falls Referrals and appointments  In addition, I have reviewed and discussed with patient certain preventive protocols, quality metrics, and best practice recommendations. A written personalized care plan for preventive services as well as general preventive health recommendations were provided to patient.     Marzella Schleinina H Francee Setzer, LPN   6/04/54096/03/2022   Nurse Notes: None

## 2021-09-18 ENCOUNTER — Encounter: Payer: Medicare Other | Admitting: Rehabilitative and Restorative Service Providers"

## 2021-09-23 ENCOUNTER — Encounter: Payer: Self-pay | Admitting: Rehabilitative and Restorative Service Providers"

## 2021-09-23 ENCOUNTER — Ambulatory Visit (INDEPENDENT_AMBULATORY_CARE_PROVIDER_SITE_OTHER): Payer: Medicare Other | Admitting: Rehabilitative and Restorative Service Providers"

## 2021-09-23 DIAGNOSIS — R278 Other lack of coordination: Secondary | ICD-10-CM

## 2021-09-23 DIAGNOSIS — M25632 Stiffness of left wrist, not elsewhere classified: Secondary | ICD-10-CM | POA: Diagnosis not present

## 2021-09-23 DIAGNOSIS — M6281 Muscle weakness (generalized): Secondary | ICD-10-CM

## 2021-09-23 DIAGNOSIS — R600 Localized edema: Secondary | ICD-10-CM | POA: Diagnosis not present

## 2021-09-23 DIAGNOSIS — M25642 Stiffness of left hand, not elsewhere classified: Secondary | ICD-10-CM | POA: Diagnosis not present

## 2021-09-23 DIAGNOSIS — M25532 Pain in left wrist: Secondary | ICD-10-CM

## 2021-09-23 NOTE — Therapy (Signed)
OUTPATIENT OCCUPATIONAL THERAPY TREATMENT NOTE   Patient Name: Mercedes Briggs MRN: [id number] DOB:[birth year], 71 y.o., female Today's Date: 09/23/2021  PCP: Dr. Billey Chang REFERRING PROVIDER: Dr. Sherilyn Cooter   END OF SESSION:   OT End of Session - 09/23/21 0802     Visit Number 7    Number of Visits 14    Date for OT Re-Evaluation 10/16/21    Authorization Type Medicare & AARP    Progress Note Due on Visit 10    OT Start Time 0802    OT Stop Time 0857    OT Time Calculation (min) 55 min    Activity Tolerance Patient tolerated treatment well;No increased pain;Patient limited by pain    Behavior During Therapy [place] for tasks assessed/performed             Past Medical History:  Diagnosis Date   Age related osteoporosis 11/28/2019   Dexa 11/2019: T = - 2.5 lumbar spine. Offer treatment.    Herniated disc, cervical    Hyperlipidemia    Hypertension    Hypothyroidism    Past Surgical History:  Procedure Laterality Date   ABDOMINAL HYSTERECTOMY     ANTERIOR CERVICAL DISCECTOMY     CESAREAN SECTION     OPEN REDUCTION INTERNAL FIXATION (ORIF) DISTAL RADIAL FRACTURE Left 08/05/2021   Procedure: LEFT OPEN REDUCTION INTERNAL FIXATION (ORIF) DISTAL RADIAL FRACTURE;  Surgeon: Sherilyn Cooter, MD;  Location: [place];  Service: Orthopedics;  Laterality: Left;   TONSILLECTOMY     Patient Active Problem List   Diagnosis Date Noted   Closed fracture of left distal radius 07/30/2021   Age related osteoporosis 11/28/2019   Refusal of statin medication by patient 09/26/2019   Acquired hypothyroidism 12/06/2015   White coat syndrome with diagnosis of hypertension 12/06/2015   History of fusion of cervical spine 12/06/2015   Mixed hyperlipidemia 12/06/2015    ONSET DATE: DOS 08/05/21   REFERRING DIAG: L45.625W (ICD-10-CM) - Closed Barton's fracture of left radius, initial encounter  THERAPY DIAG:  Pain in left wrist  Stiffness of left wrist, not elsewhere  classified  Muscle weakness (generalized)  Localized edema  Stiffness of left hand, not elsewhere classified  Other lack of coordination  Rationale for Evaluation and Treatment Rehabilitation  PERTINENT HISTORY: Per MD: "Left distal radius fracture s/p ORIF"    PRECAUTIONS: 7 weeks out from sx now, NWB in Left hand/wrist now, 5-6 weeks wrist PRE, joint mobs and wean from orthotics as possible   WEIGHT BEARING RESTRICTIONS PR as tolerated now   SUBJECTIVE:  She states Having some discomfort today, also c/o tight fascial band through L palm today by thumb. MD advised her to f/u about that as needed.   PAIN:  Are you having pain? Yes  Rating: 1-2/10 at rest now    OBJECTIVE: (All objective assessments below are from initial evaluation on: 08/21/21 unless otherwise specified.)   HAND DOMINANCE: Right   ADLs: Overall ADLs: Eval: Pain and problems doing home care, putting on socks, fasteners, etc.  09/09/21: She states now able to take off lids from bottles, etc.    FUNCTIONAL OUTCOME MEASURES: Eval: Quick Dash: 27% impairment today   UE ROM   Eval: 1.3cm gap from pad of MF to [facility] today (but does have hx of trigger fingers and limited mobility at baseline in Lt hand). Lt thumb opposes 2cm gap to base of SF, but full opposition to pad of SF.   09/09/21: She has full fist and full opposition  to base of SF today.   Active ROM Left 08/21/2021 Left 09/12/21 Left  09/16/21  Shoulder flexion 144*    Shoulder abduction      Shoulder adduction      Shoulder extension      Shoulder internal rotation      Shoulder external rotation      Elbow flexion 150*    Elbow extension (-20*)     Wrist flexion 40 44  (62* opposite side)  50  Wrist extension 35 (63* in rt)  47*  (65* opposite hand) 46  Wrist pronation 80*    Wrist supination 80* pulls    (Blank rows = not tested)     UE MMT:      MMT Left TBD  Elbow flexion    Elbow extension    Wrist flexion    Wrist extension     Wrist ulnar deviation    Wrist radial deviation    Wrist pronation    Wrist supination    (Blank rows = not tested)   HAND FUNCTION: Eval: Grip strength: Right: 57 lbs; Left: TBD lbs  09/09/21: Grip: Left: 35.7 lbs  09/12/21: Grip: Left: 32 lbs  09/16/21: 38#   COORDINATION: 09/03/21:  Box and Blocks: Left 47 blocks (51 WFL)    SENSATION: LT intact in Left hand, some c/o about paresthesia in left neck/shoulder since fall.    EDEMA:  Eval: 19.9cm circumferentially around Lt MCP Js.  09/03/21: 19.65cm circumferentially around Lt MCP Js.  09/09/21: 19.6cm circumferentially around Lt MCP Js (this is less than right hand)    COGNITION: Overall cognitive status: Within functional limits for tasks assessed   OBSERVATIONS: 08/21/21: pt seems to have low pain, good finger motion, very stiff at wrist, but fairly good FA motion as well.      TODAY'S TREATMENT:  09/23/21: She reviews HEP while on moist heat (feels looser, no irritation), OT edu on new wrist exercises and dynamic activities today, which she performs well with signs of fatigue but no pain, after IASTM and stretches to wrist, thumb and IF (manual therapy) which helps with PROM and acquiring requisite functional motion.  At end, she states understanding HEP and POC, importance of her doing stretches 4x  day and adding new strength 2-3 x day as tolerated. Also gave thumb opponens stretches due to c/o left thumb CMC tightness and some pain.   Exercises - Wrist Prayer Stretch  - 3-4 x daily - 3-5 reps - 15 sec hold - Seated Wrist Flexion with Overpressure  - 3-4 x daily - 3-5 reps - 15 sec hold - Seated Finger Composite Flexion Stretch  - 1-2 x daily - 3-5 reps - 15 hold - Seated Thumb MP Flexion PROM  - 1-2 x daily - 1 sets - 10-15 reps - Stretch Thumb into "C-Shape" (don't use other thumb)  - 2-3 x daily - 3-5 reps - 15 sec hold - ECCENTRIC HAMMER (SLOWLY LET DOWN)   - 2-3 x daily - 1-2 sets - 5-15 reps - Wrist Extension with  Resistance  - 1-2 x daily - 1 sets - 10-15 reps - Wrist Flexion with Resistance  - 1-2 x daily - 1 sets - 10-15 reps - Wrist AROM Dart Throwers Motion with Resistance  - 2-3 x daily - 5-10 reps  09/16/21: She performs grip, AROM and strength against resistance for new measures and exercises/activities, new wrist weighted PRE with 2# added (in wrist flex, ext, and pro/supination) after manual  therapy stretches to wrist and MF in various wrist positions as she tolerates (moderate stretches today) also light joint mobilizations at her wrist. She is advised to leave off orthotic at home now, unless lifting heavy, wear when out of house for 1-2 more weeks, continue to avoid heavy/painful lifting (<10# now). She states understanding.   PATIENT EDUCATION: Education details: see tx section above for details  Person educated: Patient Education method: Explanation, Demonstration, Verbal cues, and Handouts Education comprehension: verbalized understanding, returned demonstration, verbal cues required, and needs further education     HOME EXERCISE PROGRAM: Access Code: [email or web address] URL: [email or web address] Prepared by: Benito Mccreedy     GOALS: Goals reviewed with patient? Yes   SHORT TERM GOALS: (STG required if POC>30 days)   Pt will obtain protective, custom orthotic. Target date: 08/21/21 Goal status: MET   2.  Pt will demo/state understanding of initial HEP to improve pain levels and prerequisite motion. Target date: 08/30/21 Goal status: 09/03/21: MET though sometimes forgetful      LONG TERM GOALS:   Pt will improve functional ability by decreased impairment per Quick DASH assessment from 27% to 10% or better, for better quality of life. Target date: 10/16/21 Goal status: INITIAL   2.  Pt will improve grip strength in left hand to at least 40lbs for functional use at home and in IADLs. Target date: 10/16/21 Goal status: INITIAL   3.  Pt will improve A/ROM in left wrist  flex and ext from 40/35 respectively to at least 55* each for functional motion for tasks like reach and grasp.  Target date: 10/16/21 Goal status: INITIAL   4.  Pt will improve strength in left wrist from 3-/5 MMT painful to at least 4+/5 MMT to have increased functional ability to carry out selfcare and higher-level homecare tasks with no difficulty. Target date: 10/16/21 Goal status: INITIAL       ASSESSMENT:   CLINICAL IMPRESSION: 09/23/21: She will wean from orthotic now, as much as tolerated. MD visit found no more fx lines, healing well. We will continue to progress motion and strength to help achieve fnl goals.   09/16/21: She is doing a bit better, tolerating weight now. Is asked to think of any specific functional problems going on at home or in the community. Will need a reassessment in next 3-4 visits at least     PLAN: OT FREQUENCY: 2x/week   OT DURATION: 8 weeks   PLANNED INTERVENTIONS: self care/ADL training, therapeutic exercise, therapeutic activity, neuromuscular re-education, manual therapy, scar mobilization, splinting, electrical stimulation, ultrasound, moist heat, cryotherapy, contrast bath, patient/family education, coping strategies training, and DME and/or AE instructions   RECOMMENDED OTHER SERVICES: none now    CONSULTED AND AGREED WITH PLAN OF CARE: Patient   PLAN FOR NEXT SESSION:  Continue modalities, manual therapy, new strength in all planes - review of HEP as needed   Benito Mccreedy, OTR/L, CHT 09/23/2021, 9:27 AM

## 2021-09-30 ENCOUNTER — Encounter: Payer: Self-pay | Admitting: Rehabilitative and Restorative Service Providers"

## 2021-09-30 ENCOUNTER — Ambulatory Visit (INDEPENDENT_AMBULATORY_CARE_PROVIDER_SITE_OTHER): Payer: Medicare Other | Admitting: Rehabilitative and Restorative Service Providers"

## 2021-09-30 DIAGNOSIS — M6281 Muscle weakness (generalized): Secondary | ICD-10-CM

## 2021-09-30 DIAGNOSIS — R278 Other lack of coordination: Secondary | ICD-10-CM

## 2021-09-30 DIAGNOSIS — M25532 Pain in left wrist: Secondary | ICD-10-CM

## 2021-09-30 DIAGNOSIS — R600 Localized edema: Secondary | ICD-10-CM | POA: Diagnosis not present

## 2021-09-30 DIAGNOSIS — M25632 Stiffness of left wrist, not elsewhere classified: Secondary | ICD-10-CM

## 2021-09-30 DIAGNOSIS — M25642 Stiffness of left hand, not elsewhere classified: Secondary | ICD-10-CM

## 2021-09-30 NOTE — Therapy (Signed)
OUTPATIENT OCCUPATIONAL THERAPY TREATMENT & Discharge NOTE   Patient Name: Mercedes Briggs MRN: [id number] DOB:15-Aug-1950, 71 y.o., female Today's Date: 09/30/2021  PCP: Dr. Asencion Partridge REFERRING PROVIDER: Dr. Marlyne Beards   END OF SESSION:   OT End of Session - 09/30/21 0802     Visit Number 8    Number of Visits 14    Date for OT Re-Evaluation 10/16/21    Authorization Type Medicare & AARP    Progress Note Due on Visit 10    OT Start Time 0802    OT Stop Time 0850    OT Time Calculation (min) 48 min    Activity Tolerance Patient tolerated treatment well;No increased pain;Patient limited by pain    Behavior During Therapy [facility] for tasks assessed/performed              Past Medical History:  Diagnosis Date   Age related osteoporosis 11/28/2019   Dexa 11/2019: T = - 2.5 lumbar spine. Offer treatment.    Herniated disc, cervical    Hyperlipidemia    Hypertension    Hypothyroidism    Past Surgical History:  Procedure Laterality Date   ABDOMINAL HYSTERECTOMY     ANTERIOR CERVICAL DISCECTOMY     CESAREAN SECTION     OPEN REDUCTION INTERNAL FIXATION (ORIF) DISTAL RADIAL FRACTURE Left 08/05/2021   Procedure: LEFT OPEN REDUCTION INTERNAL FIXATION (ORIF) DISTAL RADIAL FRACTURE;  Surgeon: Marlyne Beards, MD;  Location: [facility];  Service: Orthopedics;  Laterality: Left;   TONSILLECTOMY     Patient Active Problem List   Diagnosis Date Noted   Closed fracture of left distal radius 07/30/2021   Age related osteoporosis 11/28/2019   Refusal of statin medication by patient 09/26/2019   Acquired hypothyroidism 12/06/2015   White coat syndrome with diagnosis of hypertension 12/06/2015   History of fusion of cervical spine 12/06/2015   Mixed hyperlipidemia 12/06/2015    ONSET DATE: DOS 08/05/21   REFERRING DIAG: J19.147W (ICD-10-CM) - Closed Barton's fracture of left radius, initial encounter  THERAPY DIAG:  Pain in left wrist  Stiffness of left wrist, not  elsewhere classified  Localized edema  Other lack of coordination  Stiffness of left hand, not elsewhere classified  Muscle weakness (generalized)  Rationale for Evaluation and Treatment Rehabilitation  PERTINENT HISTORY: Per MD: "Left distal radius fracture s/p ORIF"    PRECAUTIONS: 8 weeks out from sx now, NWB in Left hand/wrist now, 5-6 weeks wrist PRE, joint mobs and wean from orthotics as possible   WEIGHT BEARING RESTRICTIONS PR as tolerated now   SUBJECTIVE:  She states working out at home and up to red t-band now. She states well please with her function. She does have some hypersensitivity reported at volar scar area. She also asks "when do you think I'll be done because I'm very pleased with my outcomes?"    PAIN:  Are you having pain? Yes Rating: 0.5/10 at rest now    OBJECTIVE: (All objective assessments below are from initial evaluation on: 08/21/21 unless otherwise specified.)   HAND DOMINANCE: Right   ADLs: Overall ADLs: Eval: Pain and problems doing home care, putting on socks, fasteners, etc.  09/09/21: She states now able to take off lids from bottles, etc.    FUNCTIONAL OUTCOME MEASURES: Eval: Quick Dash: 27% impairment today  09/30/21: Quick Dash: 9% impairment today    UE ROM   Eval: 1.3cm gap from pad of MF to [facility] today (but does have hx of trigger fingers and limited mobility  at baseline in Lt hand). Lt thumb opposes 2cm gap to base of SF, but full opposition to pad of SF.   09/09/21: She has full fist and full opposition to base of SF today.  09/30/21: she makes full fist today, IF looks somewhat tight. Full opposition   Active ROM Left 08/21/2021 Left 09/12/21 Left  09/16/21 Left  09/30/21  Shoulder flexion 144*     Shoulder abduction       Shoulder adduction       Shoulder extension       Shoulder internal rotation       Shoulder external rotation       Elbow flexion 150*     Elbow extension (-20*)      Wrist flexion 40 44  (62* opposite  side)  50 55  Wrist extension 35 (63* in rt)  47*  (65* opposite hand) 46 49  Wrist pronation 80*     Wrist supination 80* pulls     (Blank rows = not tested)     UE MMT:      MMT Left 09/30/21  Elbow flexion 5/5   Elbow extension 5/5   Wrist flexion 4+/5   Wrist extension 4+/5   Wrist ulnar deviation 5/5   Wrist radial deviation 5/5   (Blank rows = not tested)   HAND FUNCTION: 09/09/21: Grip: Left: 35.7 lbs 09/12/21: Grip: Left: 32 lbs 09/16/21: 38#  09/30/21: 42#    COORDINATION: 09/03/21:  Box and Blocks: Left 47 blocks (51 WFL)   09/30/21: Box and Blocks: Left 55 blocks (51 WFL)    SENSATION: LT intact in Left hand, some c/o about paresthesia in left neck/shoulder since fall.    EDEMA:  Eval: 19.9cm circumferentially around Lt MCP Js.  09/03/21: 19.65cm circumferentially around Lt MCP Js.  09/09/21: 19.6cm circumferentially around Lt MCP Js (this is less than right hand)    COGNITION: Overall cognitive status: Within functional limits for tasks assessed   OBSERVATIONS: 08/21/21: pt seems to have low pain, good finger motion, very stiff at wrist, but fairly good FA motion as well.      TODAY'S TREATMENT:  09/30/21: She does AROM and functional activities and grasp to check goals and discusses POC and pt goals. She states not having any significant home issues now, no significant pains. Is given edu on contrast bath to be done for c/o hypersensitivity and remaining swelling. OT also reviews each part of HEP as below and she states that she feels comfortable continuing on herself at home. OT advises no heavy dynamic tasks(sports) for another month or anything painful, she states understanding but mostly doing sedate activities.   Exercises - Wrist Prayer Stretch  - 3-4 x daily - 3-5 reps - 15 sec hold - Seated Wrist Flexion with Overpressure  - 3-4 x daily - 3-5 reps - 15 sec hold - Seated Finger Composite Flexion Stretch  - 1-2 x daily - 3-5 reps - 15 hold - Seated Thumb MP  Flexion PROM  - 1-2 x daily - 1 sets - 10-15 reps - Stretch Thumb into "C-Shape" (don't use other thumb)  - 2-3 x daily - 3-5 reps - 15 sec hold - ECCENTRIC HAMMER (SLOWLY LET DOWN)   - 2-3 x daily - 1-2 sets - 5-15 reps - Wrist Extension with Resistance  - 1-2 x daily - 1 sets - 10-15 reps - Wrist Flexion with Resistance  - 1-2 x daily - 1 sets - 10-15 reps - Wrist  AROM Dart Throwers Motion with Resistance  - 2-3 x daily - 5-10 reps   PATIENT EDUCATION: Education details: see tx section above for details  Person educated: Patient Education method: Explanation, Demonstration, Verbal cues, and Handouts Education comprehension: verbalized understanding, returned demonstration, verbal cues required, and needs further education     HOME EXERCISE PROGRAM: Access Code: [email or web address] URL: [email or web address] Prepared by: Fannie Knee     GOALS: Goals reviewed with patient? Yes   SHORT TERM GOALS: (STG required if POC>30 days)   Pt will obtain protective, custom orthotic. Target date: 08/21/21 Goal status: MET   2.  Pt will demo/state understanding of initial HEP to improve pain levels and prerequisite motion. Target date: 08/30/21 Goal status: 09/03/21: MET though sometimes forgetful      LONG TERM GOALS:   Pt will improve functional ability by decreased impairment per Quick DASH assessment from 27% to 10% or better, for better quality of life. Target date: 10/16/21 Goal status: 09/30/21: MET    2.  Pt will improve grip strength in left hand to at least 40lbs for functional use at home and in IADLs. Target date: 10/16/21 Goal status: 09/30/21: Met   3.  Pt will improve A/ROM in left wrist flex and ext from 40/35 respectively to at least 55* each for functional motion for tasks like reach and grasp.  Target date: 10/16/21 Goal status: 09/30/21: Parietally Met (still mildly lacking in ext)    4.  Pt will improve strength in left wrist from 3-/5 MMT painful to at least  4+/5 MMT to have increased functional ability to carry out selfcare and higher-level homecare tasks with no difficulty. Target date: 10/16/21 Goal status: 09/30/21: MET       ASSESSMENT:   CLINICAL IMPRESSION: 09/30/21: She is stating no significant problems at home, "very pleased" with motion and outcomes. She states she can continue to do HEP and progress herself at home. OT agrees to D/C today.     PLAN: OT FREQUENCY: D/C now    OT DURATION: D/C now    PLANNED INTERVENTIONS: self care/ADL training, therapeutic exercise, therapeutic activity, neuromuscular re-education, manual therapy, scar mobilization, splinting, electrical stimulation, ultrasound, moist heat, cryotherapy, contrast bath, patient/family education, coping strategies training, and DME and/or AE instructions   RECOMMENDED OTHER SERVICES: none now    CONSULTED AND AGREED WITH PLAN OF CARE: Patient   PLAN FOR NEXT SESSION:  D/C now    [employer], OTR/L, CHT 09/30/2021, 6:11 PM  OCCUPATIONAL THERAPY DISCHARGE SUMMARY  Visits from Start of Care: 8  Current functional level related to goals / functional outcomes: Pt has met all goals to satisfactory levels and is pleased with outcomes.   Remaining deficits: Pt has no more significant functional deficits or pain.   Education / Equipment: Pt has all needed materials and education. Pt understands how to continue on with self-management. See tx notes for more details.   Patient agrees to discharge due to max benefits received from outpatient occupational therapy / hand therapy at this time.   Fannie Knee, OTR/L, CHT 09/30/21

## 2021-10-04 DIAGNOSIS — L03113 Cellulitis of right upper limb: Secondary | ICD-10-CM | POA: Diagnosis not present

## 2021-10-04 DIAGNOSIS — L03114 Cellulitis of left upper limb: Secondary | ICD-10-CM | POA: Diagnosis not present

## 2021-10-04 DIAGNOSIS — R609 Edema, unspecified: Secondary | ICD-10-CM | POA: Diagnosis not present

## 2021-10-10 ENCOUNTER — Encounter: Payer: Medicare Other | Admitting: Rehabilitative and Restorative Service Providers"

## 2021-10-15 ENCOUNTER — Ambulatory Visit: Payer: Medicare Other | Admitting: Orthopedic Surgery

## 2021-10-29 ENCOUNTER — Ambulatory Visit (INDEPENDENT_AMBULATORY_CARE_PROVIDER_SITE_OTHER): Payer: Medicare Other

## 2021-10-29 ENCOUNTER — Ambulatory Visit (INDEPENDENT_AMBULATORY_CARE_PROVIDER_SITE_OTHER): Payer: Medicare Other | Admitting: Orthopedic Surgery

## 2021-10-29 DIAGNOSIS — S52562A Barton's fracture of left radius, initial encounter for closed fracture: Secondary | ICD-10-CM | POA: Diagnosis not present

## 2021-10-29 NOTE — Progress Notes (Signed)
   Post-Op Visit Note   Patient: Mercedes Briggs           Date of Birth: 25-Sep-1950           MRN: [id number] Visit Date: 10/29/2021 PCP: Willow Ora, MD   Assessment & Plan:  Chief Complaint:  Chief Complaint  Patient presents with   Left Wrist - Routine Post Op   Visit Diagnoses:  1. Closed Barton's fracture of left radius, initial encounter     Plan: Patient is 12 weeks status post ORIF of her left distal radius.  She is doing great today.  She has no pain.  She is able make a full and complete fist.  She has full range of motion of her wrist that is symmetric to the contralateral side.  Repeat x-rays today show unchanged hardware position with a healed distal radius fracture in anatomic alignment.  He is very pleased with her result.  Discussed that she will likely see continued improvement in her grip strength until 6 months to a year after surgery.  I can see her back again as needed..  Follow-Up Instructions: No follow-ups on file.   Orders:  Orders Placed This Encounter  Procedures   XR Wrist Complete Left   No orders of the defined types were placed in this encounter.   Imaging: No results found.  PMFS History: Patient Active Problem List   Diagnosis Date Noted   Closed fracture of left distal radius 07/30/2021   Age related osteoporosis 11/28/2019   Refusal of statin medication by patient 09/26/2019   Acquired hypothyroidism 12/06/2015   White coat syndrome with diagnosis of hypertension 12/06/2015   History of fusion of cervical spine 12/06/2015   Mixed hyperlipidemia 12/06/2015   Past Medical History:  Diagnosis Date   Age related osteoporosis 11/28/2019   Dexa 11/2019: T = - 2.5 lumbar spine. Offer treatment.    Herniated disc, cervical    Hyperlipidemia    Hypertension    Hypothyroidism     Family History  Problem Relation Age of Onset   Alzheimer's disease Mother    Arthritis Mother    Atrial fibrillation Mother    Dementia Mother     Hypertension Mother    Asthma Father    COPD Father    Heart disease Father    Hypertension Father    Diabetes Brother    Neurodegenerative disease Brother        storage cell disease    Past Surgical History:  Procedure Laterality Date   ABDOMINAL HYSTERECTOMY     ANTERIOR CERVICAL DISCECTOMY     CESAREAN SECTION     OPEN REDUCTION INTERNAL FIXATION (ORIF) DISTAL RADIAL FRACTURE Left 08/05/2021   Procedure: LEFT OPEN REDUCTION INTERNAL FIXATION (ORIF) DISTAL RADIAL FRACTURE;  Surgeon: Marlyne Beards, MD;  Location: [facility];  Service: Orthopedics;  Laterality: Left;   TONSILLECTOMY     Social History   Occupational History   Occupation: Retired     Comment: Doctor, general practice   Tobacco Use   Smoking status: Never   Smokeless tobacco: Never   Tobacco comments:    occasionally   Vaping Use   Vaping Use: Never used  Substance and Sexual Activity   Alcohol use: Yes    Comment: socially   Drug use: Never   Sexual activity: Yes    Birth control/protection: Post-menopausal

## 2021-11-21 ENCOUNTER — Other Ambulatory Visit: Payer: Self-pay | Admitting: Family Medicine

## 2021-12-30 ENCOUNTER — Encounter: Payer: Self-pay | Admitting: *Deleted

## 2022-01-07 ENCOUNTER — Ambulatory Visit: Payer: Medicare Other | Admitting: Family Medicine

## 2022-01-21 ENCOUNTER — Encounter: Payer: Self-pay | Admitting: Family Medicine

## 2022-01-21 ENCOUNTER — Ambulatory Visit (INDEPENDENT_AMBULATORY_CARE_PROVIDER_SITE_OTHER): Payer: Medicare Other | Admitting: Family Medicine

## 2022-01-21 VITALS — BP 124/76 | HR 82 | Temp 98.5°F | Ht 64.0 in | Wt 126.0 lb

## 2022-01-21 DIAGNOSIS — M810 Age-related osteoporosis without current pathological fracture: Secondary | ICD-10-CM

## 2022-01-21 DIAGNOSIS — E039 Hypothyroidism, unspecified: Secondary | ICD-10-CM

## 2022-01-21 DIAGNOSIS — Z23 Encounter for immunization: Secondary | ICD-10-CM

## 2022-01-21 DIAGNOSIS — Z1231 Encounter for screening mammogram for malignant neoplasm of breast: Secondary | ICD-10-CM

## 2022-01-21 DIAGNOSIS — Z7185 Encounter for immunization safety counseling: Secondary | ICD-10-CM | POA: Diagnosis not present

## 2022-01-21 DIAGNOSIS — I10 Essential (primary) hypertension: Secondary | ICD-10-CM | POA: Diagnosis not present

## 2022-01-21 DIAGNOSIS — R634 Abnormal weight loss: Secondary | ICD-10-CM

## 2022-01-21 DIAGNOSIS — Z780 Asymptomatic menopausal state: Secondary | ICD-10-CM | POA: Diagnosis not present

## 2022-01-21 DIAGNOSIS — F430 Acute stress reaction: Secondary | ICD-10-CM

## 2022-01-21 LAB — CBC WITH DIFFERENTIAL/PLATELET
Basophils Absolute: 0 10*3/uL (ref 0.0–0.1)
Basophils Relative: 0.5 % (ref 0.0–3.0)
Eosinophils Absolute: 0.1 10*3/uL (ref 0.0–0.7)
Eosinophils Relative: 1.9 % (ref 0.0–5.0)
HCT: 42.9 % (ref 36.0–46.0)
Hemoglobin: 14.6 g/dL (ref 12.0–15.0)
Lymphocytes Relative: 34.6 % (ref 12.0–46.0)
Lymphs Abs: 1.9 10*3/uL (ref 0.7–4.0)
MCHC: 33.9 g/dL (ref 30.0–36.0)
MCV: 93.9 fl (ref 78.0–100.0)
Monocytes Absolute: 0.5 10*3/uL (ref 0.1–1.0)
Monocytes Relative: 9.4 % (ref 3.0–12.0)
Neutro Abs: 2.9 10*3/uL (ref 1.4–7.7)
Neutrophils Relative %: 53.6 % (ref 43.0–77.0)
Platelets: 234 10*3/uL (ref 150.0–400.0)
RBC: 4.57 Mil/uL (ref 3.87–5.11)
RDW: 13.1 % (ref 11.5–15.5)
WBC: 5.4 10*3/uL (ref 4.0–10.5)

## 2022-01-21 LAB — COMPREHENSIVE METABOLIC PANEL
ALT: 39 U/L — ABNORMAL HIGH (ref 0–35)
AST: 31 U/L (ref 0–37)
Albumin: 4.4 g/dL (ref 3.5–5.2)
Alkaline Phosphatase: 92 U/L (ref 39–117)
BUN: 13 mg/dL (ref 6–23)
CO2: 28 mEq/L (ref 19–32)
Calcium: 9.7 mg/dL (ref 8.4–10.5)
Chloride: 100 mEq/L (ref 96–112)
Creatinine, Ser: 0.64 mg/dL (ref 0.40–1.20)
GFR: 89.27 mL/min (ref >60.00)
Glucose, Bld: 98 mg/dL (ref 70–99)
Potassium: 4.3 mEq/L (ref 3.5–5.1)
Sodium: 136 mEq/L (ref 135–145)
Total Bilirubin: 0.5 mg/dL (ref 0.2–1.2)
Total Protein: 6.7 g/dL (ref 6.0–8.3)

## 2022-01-21 LAB — SEDIMENTATION RATE: Sed Rate: 7 mm/hr (ref 0–30)

## 2022-01-21 LAB — TSH: TSH: 1.12 u[IU]/mL (ref 0.35–5.50)

## 2022-01-21 MED ORDER — SHINGRIX 50 MCG/0.5ML IM SUSR: 0.5000 mL | Freq: Once | INTRAMUSCULAR | 0 refills | Status: AC

## 2022-01-21 NOTE — Progress Notes (Signed)
Subjective  Chief Complaint  Patient presents with   Hypothyroidism   Annual Exam   Hyperlipidemia   Osteoporosis   Hypertension    HPI: Mercedes Briggs is a 71 y.o. female who presents to [facility] [place] today for a Female Wellness Visit. She also has the concerns and/or needs as listed above in the chief complaint. These will be addressed in addition to the Health Maintenance Visit.   Wellness Visit: annual visit with health maintenance review and exam without Pap  HM: 71 yo postmenopausal: due for mammogram and dexa. CRC screen with cologuard is current. Eligible for shingrix and flu vaccines. Healthy lifestyle: walker, balanced diet.  Chronic disease f/u and/or acute problem visit: (deemed necessary to be done in addition to the wellness visit): C/o weight loss due to less caloric intake due to low appetite. Has lost about 10 pounds this year. C/o poor sleep, more worry, brain fog, feeling jittery, more weepy, and hot sweats at night w/o palpitations, significant tremor. She denies fevers, abdominal pain, cough, swelling, melena. She admits life has been more stressful due to multiple factors including a wrist fracture requiring ORIF, husband illness, political events in the world that are stressful, worry about her grown children. 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 She feels similar now. No panic sxs. H/o alzheimers in mom and she worries about that. Starting to think of end of life issues as well. Overall, coping fine but due to above listed symptoms, wants to be sure she in ok.  Osteoporosis: see last years note. Had been on fosamx but AE with GI upset. Recommended prolia but pt is hesitant. + FH. Due for repeat dexa. Walks and takes supplements. No fragility or compression fractures.  White coat HTN: has log from last year: all normal at home. On lisinopril 5 daily. Feeling well. Taking medications w/o adverse effects. No  symptoms of CHF, angina; no palpitations, sob, cp or lower extremity edema. Compliant with meds.  Low thyroid on levotx daily. Due for recheck. Has some sxs of hyperthyroidism as listed above. Weight is down.  Assessment  1. Acquired hypothyroidism   2. White coat syndrome with diagnosis of hypertension   3. Age related osteoporosis, unspecified pathological fracture presence   4. Asymptomatic menopausal state   5. Encounter for screening mammogram for breast cancer   6. Need for immunization against influenza   7. Weight loss   8. Stress reaction   9. Vaccine counseling      Plan  Female Wellness Visit: Age appropriate Health Maintenance and Prevention measures were discussed with patient. Included topics are cancer screening recommendations, ways to keep healthy (see AVS) including dietary and exercise recommendations, regular eye and dental care, use of seat belts, and avoidance of moderate alcohol use and tobacco use. Mammo ordered BMI: discussed patient's BMI and encouraged positive lifestyle modifications to help get to or maintain a target BMI. HM needs and immunizations were addressed and ordered. See below for orders. See HM and immunization section for updates. Flu shot today. Educated on shingrix vaccine and RX printed Routine labs and screening tests ordered including cmp, cbc and lipids where appropriate. Discussed recommendations regarding Vit D and calcium supplementation (see AVS)  Chronic disease management visit and/or acute problem visit: Stress reaction: at risk for recurrent depression. Counseling and education done. Pt to self monitor and behaviorally manage. To return if worsening. R/o organic problem with labs.  Hypothyroidism: recheck TSH and adjust meds  as needed. May need to lower dose give sxs noted above. Levotx 100 daily HLD: no longer screening; pt refuses treatment. Eats healthy diet.  Weight loss: discussed better nutrition.  Osteoporosis: check  dexa and rec Prolia if indicated. Pt is hesitant.  HTN is controlled. White coat component noted. Continue home monitoring and lisinopril 5 daily.   I spent a total of  52 minutes for this patient encounter. Time spent included preparation, face-to-face counseling with the patient and coordination of care, review of chart and records, and documentation of the encounter.  Follow up: Return in about 1 year (around 01/22/2023) for complete physical.  Orders Placed This Encounter  Procedures   MM DIGITAL SCREENING BILATERAL   DG Bone Density   Flu Vaccine QUAD High Dose(Fluad)   CBC with Differential/Platelet   Comprehensive metabolic panel   TSH   Sedimentation rate   Meds ordered this encounter  Medications   Zoster Vaccine Adjuvanted [facility]) injection    Sig: Inject 0.5 mLs into the muscle once for 1 dose. Please give 2nd dose 2-6 months after first dose    Dispense:  2 each    Refill:  0      Body mass index is 21.63 kg/m. Wt Readings from Last 3 Encounters:  01/21/22 126 lb (57.2 kg)  08/05/21 135 lb (61.2 kg)  07/30/21 136 lb 0.4 oz (61.7 kg)     Patient Active Problem List   Diagnosis Date Noted   Closed fracture of left distal radius 07/30/2021   Age related osteoporosis 11/28/2019    Dexa 11/2019: T = - 2.5 lumbar spine. Offer treatment.     Refusal of statin medication by patient 09/26/2019   Acquired hypothyroidism 12/06/2015   White coat syndrome with diagnosis of hypertension 12/06/2015   History of fusion of cervical spine 12/06/2015   Mixed hyperlipidemia 12/06/2015    Elevated ASCVD score and lipids but declines meds.     Health Maintenance  Topic Date Due   Zoster Vaccines- Shingrix (1 of 2) Never done   DEXA SCAN  11/22/2021   MAMMOGRAM  01/16/2022   COVID-19 Vaccine (4 - Moderna series) 02/06/2022 (Originally 04/03/2020)   Fecal DNA (Cologuard)  08/12/2022   Pneumonia Vaccine 32+ Years old  Completed   INFLUENZA VACCINE  Completed   Hepatitis  C Screening  Completed   HPV VACCINES  Aged Out   Immunization History  Administered Date(s) Administered   Fluad Quad(high Dose 65+) 01/21/2022   Influenza, High Dose Seasonal PF 04/19/2016   Moderna Sars-Covid-2 Vaccination 05/20/2019, 06/17/2019, 02/07/2020   Pneumococcal Conjugate-13 04/19/2016   Pneumococcal Polysaccharide-23 12/14/2017   Zoster, Live 12/06/2015   We updated and reviewed the patient's past history in detail and it is documented below. Allergies: Patient is allergic to penicillins. Past Medical History Patient  has a past medical history of Age related osteoporosis (11/28/2019), Herniated disc, cervical, Hyperlipidemia, Hypertension, and Hypothyroidism. Past Surgical History Patient  has a past surgical history that includes Anterior cervical discectomy; Cesarean section; Tonsillectomy; Abdominal hysterectomy; and Open reduction internal fixation (orif) distal radial fracture (Left, 08/05/2021). Family History: Patient family history includes Alzheimer's disease in her mother; Arthritis in her mother; Asthma in her father; Atrial fibrillation in her mother; COPD in her father; Dementia in her mother; Diabetes in her brother; Heart disease in her father; Hypertension in her father and mother; Neurodegenerative disease in her brother. Social History:  Patient  reports that she has never smoked. She has never used smokeless tobacco.  She reports current alcohol use. She reports that she does not use drugs.  Review of Systems: Constitutional: negative for fever or malaise Ophthalmic: negative for photophobia, double vision or loss of vision Cardiovascular: negative for chest pain, dyspnea on exertion, or new LE swelling Respiratory: negative for SOB or persistent cough Gastrointestinal: negative for abdominal pain, change in bowel habits or melena Genitourinary: negative for dysuria or gross hematuria, no abnormal uterine bleeding or disharge Musculoskeletal: negative for  new gait disturbance or muscular weakness Integumentary: negative for new or persistent rashes, no breast lumps Neurological: negative for TIA or stroke symptoms Psychiatric: negative for SI or delusions Allergic/Immunologic: negative for hives  Patient Care Team    Relationship Specialty Notifications Start End  Willow Ora, MD PCP - General Family Medicine  12/14/17   Annette Stable, OD (Inactive) Consulting Physician Optometry  07/21/19     Objective  Vitals: BP 124/76 Comment: by home readings  Pulse 82   Temp 98.5 F (36.9 C)   Ht 5\' 4"  (1.626 m)   Wt 126 lb (57.2 kg)   SpO2 96%   BMI 21.63 kg/m  General:  Well developed, well nourished, no acute distress  Psych:  Alert and orientedx3,normal mood and affect however a little flatter than normal HEENT:  Normocephalic, atraumatic, non-icteric sclera,  supple neck without adenopathy, mass or thyromegaly Cardiovascular:  Normal S1, S2, RRR without gallop, rub or murmur Respiratory:  Good breath sounds bilaterally, CTAB with normal respiratory effort Gastrointestinal: normal bowel sounds, soft, non-tender, no noted masses. No HSM MSK: no deformities, contusions. Joints are without erythema or swelling.  Skin:  Warm, no rashes or suspicious lesions noted Neurologic:    Mental status is normal. CN 2-11 are normal. Gross motor and sensory exams are normal. Normal gait. No tremor   Commons side effects, risks, benefits, and alternatives for medications and treatment plan prescribed today were discussed, and the patient expressed understanding of the given instructions. Patient is instructed to call or message via MyChart if he/she has any questions or concerns regarding our treatment plan. No barriers to understanding were identified. We discussed Red Flag symptoms and signs in detail. Patient expressed understanding regarding what to do in case of urgent or emergency type symptoms.  Medication list was reconciled, printed and  provided to the patient in AVS. Patient instructions and summary information was reviewed with the patient as documented in the AVS. This note was prepared with assistance of Dragon voice recognition software. Occasional wrong-word or sound-a-like substitutions may have occurred due to the inherent limitations of voice recognition software

## 2022-01-21 NOTE — Patient Instructions (Signed)
Please return in 12 months for your annual complete physical; please come fasting.   I will release your lab results to you on your MyChart account with further instructions. You may see the results before I do, but when I review them I will send you a message with my report or have my assistant call you if things need to be discussed. Please reply to my message with any questions. Thank you!   Please take the prescription for Shingrix to the pharmacy so they may administer the vaccinations. Your insurance will then cover the injections.   If you have any questions or concerns, please don't hesitate to send me a message via MyChart or call the office at [phone]. Thank you for visiting with us today! It's our pleasure caring for you.   I have ordered a mammogram and/or bone density for you as we discussed today: [x]  Mammogram  [x]  Bone Density  Please call the office checked below to schedule your appointment:  [x]  [facility]      [address]       [place], [place]        [phone]         []  [facility]  [address]   [place], [place]  [phone]  

## 2022-01-28 ENCOUNTER — Other Ambulatory Visit: Payer: Self-pay | Admitting: Family Medicine

## 2022-01-28 DIAGNOSIS — M810 Age-related osteoporosis without current pathological fracture: Secondary | ICD-10-CM

## 2022-01-28 DIAGNOSIS — Z780 Asymptomatic menopausal state: Secondary | ICD-10-CM

## 2022-01-30 ENCOUNTER — Ambulatory Visit
Admission: RE | Admit: 2022-01-30 | Discharge: 2022-01-30 | Disposition: A | Discharge status: Home / Self Care | Payer: Medicare Other | Source: Ambulatory Visit | Attending: Family Medicine | Admitting: Family Medicine

## 2022-01-30 ENCOUNTER — Ambulatory Visit: Payer: Medicare Other

## 2022-01-30 DIAGNOSIS — Z1231 Encounter for screening mammogram for malignant neoplasm of breast: Secondary | ICD-10-CM | POA: Diagnosis not present

## 2022-03-06 DIAGNOSIS — M25532 Pain in left wrist: Secondary | ICD-10-CM | POA: Insufficient documentation

## 2022-03-06 HISTORY — DX: Pain in left wrist: M25.532

## 2022-03-07 DIAGNOSIS — M65342 Trigger finger, left ring finger: Secondary | ICD-10-CM | POA: Diagnosis not present

## 2022-03-07 DIAGNOSIS — M65332 Trigger finger, left middle finger: Secondary | ICD-10-CM | POA: Insufficient documentation

## 2022-03-07 DIAGNOSIS — M1812 Unilateral primary osteoarthritis of first carpometacarpal joint, left hand: Secondary | ICD-10-CM | POA: Insufficient documentation

## 2022-03-07 DIAGNOSIS — M25532 Pain in left wrist: Secondary | ICD-10-CM | POA: Diagnosis not present

## 2022-03-07 HISTORY — DX: Unilateral primary osteoarthritis of first carpometacarpal joint, left hand: M18.12

## 2022-03-07 HISTORY — DX: Trigger finger, left ring finger: M65.342

## 2022-03-07 HISTORY — DX: Trigger finger, left middle finger: M65.332

## 2022-05-20 ENCOUNTER — Other Ambulatory Visit: Payer: Self-pay | Admitting: Family Medicine

## 2022-05-26 DIAGNOSIS — H2513 Age-related nuclear cataract, bilateral: Secondary | ICD-10-CM | POA: Diagnosis not present

## 2022-05-26 DIAGNOSIS — H04123 Dry eye syndrome of bilateral lacrimal glands: Secondary | ICD-10-CM | POA: Diagnosis not present

## 2022-05-26 DIAGNOSIS — H532 Diplopia: Secondary | ICD-10-CM | POA: Diagnosis not present

## 2022-07-10 DIAGNOSIS — H532 Diplopia: Secondary | ICD-10-CM | POA: Diagnosis not present

## 2022-07-10 DIAGNOSIS — H16223 Keratoconjunctivitis sicca, not specified as Sjogren's, bilateral: Secondary | ICD-10-CM | POA: Diagnosis not present

## 2022-07-14 ENCOUNTER — Ambulatory Visit
Admission: RE | Admit: 2022-07-14 | Discharge: 2022-07-14 | Disposition: A | Discharge status: Home / Self Care | Payer: Medicare Other | Source: Ambulatory Visit | Attending: Family Medicine | Admitting: Family Medicine

## 2022-07-14 DIAGNOSIS — M810 Age-related osteoporosis without current pathological fracture: Secondary | ICD-10-CM | POA: Diagnosis not present

## 2022-07-14 DIAGNOSIS — M85852 Other specified disorders of bone density and structure, left thigh: Secondary | ICD-10-CM | POA: Diagnosis not present

## 2022-07-14 DIAGNOSIS — Z780 Asymptomatic menopausal state: Secondary | ICD-10-CM

## 2022-07-18 ENCOUNTER — Ambulatory Visit (INDEPENDENT_AMBULATORY_CARE_PROVIDER_SITE_OTHER): Payer: Medicare Other | Admitting: Family Medicine

## 2022-07-18 ENCOUNTER — Encounter: Payer: Self-pay | Admitting: Family Medicine

## 2022-07-18 VITALS — BP 120/70 | HR 67 | Temp 97.6°F | Ht 64.0 in | Wt 129.6 lb

## 2022-07-18 DIAGNOSIS — Z1212 Encounter for screening for malignant neoplasm of rectum: Secondary | ICD-10-CM | POA: Diagnosis not present

## 2022-07-18 DIAGNOSIS — I10 Essential (primary) hypertension: Secondary | ICD-10-CM | POA: Diagnosis not present

## 2022-07-18 DIAGNOSIS — M810 Age-related osteoporosis without current pathological fracture: Secondary | ICD-10-CM | POA: Diagnosis not present

## 2022-07-18 DIAGNOSIS — Z1211 Encounter for screening for malignant neoplasm of colon: Secondary | ICD-10-CM

## 2022-07-18 NOTE — Progress Notes (Unsigned)
Subjective  CC: No chief complaint on file.   HPI: Mercedes Briggs is a 72 y.o. female who presents to the office today to address the problems listed above in the chief complaint. ***  Assessment  1. Age-related osteoporosis without current pathological fracture      Plan  ***:  ***  Follow up: No follow-ups on file.  09/22/2022  No orders of the defined types were placed in this encounter.  No orders of the defined types were placed in this encounter.     I reviewed the patients updated PMH, FH, and SocHx.    Patient Active Problem List   Diagnosis Date Noted   Closed fracture of left distal radius 07/30/2021   Age related osteoporosis 11/28/2019   Refusal of statin medication by patient 09/26/2019   Acquired hypothyroidism 12/06/2015   White coat syndrome with diagnosis of hypertension 12/06/2015   History of fusion of cervical spine 12/06/2015   Mixed hyperlipidemia 12/06/2015   Current Meds  Medication Sig   Ascorbic Acid (VITAMIN C) 1000 MG tablet Take 1,000 mg by mouth daily. With a meal (lunch or supper)   B Complex-C (B-COMPLEX WITH VITAMIN C) tablet Take 1 tablet by mouth daily. With a meal (lunch or supper)   Cholecalciferol (VITAMIN D-3) 125 MCG (5000 UT) TABS Take 5,000 Units by mouth daily. With a meal (lunch or supper)   Coenzyme Q-10 100 MG capsule Take 100 mg by mouth daily. With a meal (lunch or supper)   [employer] TRIPLE MAGNESIUM COMPLEX PO Take 2 capsules by mouth daily.   ibuprofen (ADVIL) 200 MG tablet Take 600 mg by mouth every 8 (eight) hours as needed (for fracture pain.).   levothyroxine (SYNTHROID) 100 MCG tablet TAKE 1 TABLET EVERY MORNINGBEFORE BREAKFAST   lisinopril (ZESTRIL) 5 MG tablet TAKE 1 TABLET DAILY   Menaquinone-7 (VITAMIN K2) 100 MCG CAPS Take 100 mcg by mouth daily. With a meal (lunch or supper)   Milk Thistle 1000 MG CAPS Take 1,000 mg by mouth daily. With a meal (lunch or supper)   Omega-3 Fatty Acids (FISH OIL) 1000 MG  CAPS Take 2,000 mg by mouth daily. With a meal (lunch or supper)    Allergies: Patient is allergic to penicillins. Family History: Patient family history includes Alzheimer's disease in her mother; Arthritis in her mother; Asthma in her father; Atrial fibrillation in her mother; COPD in her father; Dementia in her mother; Diabetes in her brother; Heart disease in her father; Hypertension in her father and mother; Neurodegenerative disease in her brother. Social History:  Patient  reports that she has never smoked. She has never used smokeless tobacco. She reports current alcohol use. She reports that she does not use drugs.  Review of Systems: Constitutional: Negative for fever malaise or anorexia Cardiovascular: negative for chest pain Respiratory: negative for SOB or persistent cough Gastrointestinal: negative for abdominal pain  Objective  Vitals: BP 136/70   Pulse 67   Temp 97.6 F (36.4 C)   Ht 5\' 4"  (1.626 m)   Wt 129 lb 9.6 oz (58.8 kg)   SpO2 99%   BMI 22.25 kg/m  General: no acute distress , A&Ox3 HEENT: PEERL, conjunctiva normal, neck is supple Cardiovascular:  RRR without murmur or gallop.  Respiratory:  Good breath sounds bilaterally, CTAB with normal respiratory effort Skin:  Warm, no rashes  Commons side effects, risks, benefits, and alternatives for medications and treatment plan prescribed today were discussed, and the patient expressed understanding of the  given instructions. Patient is instructed to call or message via MyChart if he/she has any questions or concerns regarding our treatment plan. No barriers to understanding were identified. We discussed Red Flag symptoms and signs in detail. Patient expressed understanding regarding what to do in case of urgent or emergency type symptoms.  Medication list was reconciled, printed and provided to the patient in AVS. Patient instructions and summary information was reviewed with the patient as documented in the  AVS. This note was prepared with assistance of Dragon voice recognition software. Occasional wrong-word or sound-a-like substitutions may have occurred due to the inherent limitations of voice recognition software

## 2022-07-18 NOTE — Patient Instructions (Signed)
Please return in October for your complete physical.   We will contact you to get you scheduled for your prolia injection after we authorize it from your insurance. I have ordered the cologuard for colon cancer screening.   If you have any questions or concerns, please don't hesitate to send me a message via MyChart or call the office at [phone]. Thank you for visiting with [place] today! It's our pleasure caring for you.   Osteoporosis  Osteoporosis happens when the bones become thin and less dense than normal. Osteoporosis makes bones more brittle and fragile and more likely to break (fracture). Over time, osteoporosis can cause your bones to become so weak that they fracture after a minor fall. Bones in the hip, wrist, and spine are most likely to fracture due to osteoporosis. What are the causes? The exact cause of this condition is not known. What increases the risk? You are more likely to develop this condition if you: Have family members with this condition. Have poor nutrition. Use the following: Steroid medicines, such as prednisone. Anti-seizure medicines. Nicotine or tobacco, such as cigarettes, e-cigarettes, and chewing tobacco. Are female. Are age 64 or older. Are not physically active (are sedentary). Are of European or Asian descent. Have a small body frame. What are the signs or symptoms? A fracture might be the first sign of osteoporosis, especially if the fracture results from a fall or injury that usually would not cause a bone to break. Other signs and symptoms include: Pain in the neck or low back. Stooped posture. Loss of height. How is this diagnosed? This condition may be diagnosed based on: Your medical history. A physical exam. A bone mineral density test, also called a DXA or DEXA test (dual-energy X-ray absorptiometry test). This test uses X-rays to measure the amount of minerals in your bones. How is this treated? This condition may be treated  by: Making lifestyle changes, such as: Including foods with more calcium and vitamin D in your diet. Doing weight-bearing and muscle-strengthening exercises. Stopping tobacco use. Limiting alcohol intake. Taking medicine to slow the process of bone loss or to increase bone density. Taking daily supplements of calcium and vitamin D. Taking hormone replacement medicines, such as estrogen for women and testosterone for men. Monitoring your levels of calcium and vitamin D. The goal of treatment is to strengthen your bones and lower your risk for a fracture. Follow these instructions at home: Eating and drinking Include calcium and vitamin D in your diet. Calcium is important for bone health, and vitamin D helps your body absorb calcium. Good sources of calcium and vitamin D include: Certain fatty fish, such as salmon and tuna. Products that have calcium and vitamin D added to them (are fortified), such as fortified cereals. Egg yolks. Cheese. Liver.  Activity Do exercises as told by your health care provider. Ask your health care provider what exercises and activities are safe for you. You should do: Exercises that make you work against gravity (weight-bearing exercises), such as tai chi, yoga, or walking. Exercises to strengthen muscles, such as lifting weights. Lifestyle Do not drink alcohol if: Your health care provider tells you not to drink. You are pregnant, may be pregnant, or are planning to become pregnant. If you drink alcohol: Limit how much you use to: 0-1 drink a day for women. 0-2 drinks a day for men. Know how much alcohol is in your drink. In the [place] one drink equals one 12 oz bottle of beer (355 mL),  one 5 oz glass of wine (148 mL), or one 1 oz glass of hard liquor (44 mL). Do not use any products that contain nicotine or tobacco, such as cigarettes, e-cigarettes, and chewing tobacco. If you need help quitting, ask your health care provider. Preventing falls Use  devices to help you move around (mobility aids) as needed, such as canes, walkers, scooters, or crutches. Keep rooms well-lit and clutter-free. Remove tripping hazards from walkways, including cords and throw rugs. Install grab bars in bathrooms and safety rails on stairs. Use rubber mats in the bathroom and other areas that are often wet or slippery. Wear closed-toe shoes that fit well and support your feet. Wear shoes that have rubber soles or low heels. Review your medicines with your health care provider. Some medicines can cause dizziness or changes in blood pressure, which can increase your risk of falling. General instructions Take over-the-counter and prescription medicines only as told by your health care provider. Keep all follow-up visits. This is important. Contact a health care provider if: You have never been screened for osteoporosis and you are: A woman who is age 60 or older. A man who is age 30 or older. Get help right away if: You fall or injure yourself. Summary Osteoporosis is thinning and loss of density in your bones. This makes bones more brittle and fragile and more likely to break (fracture),even with minor falls. The goal of treatment is to strengthen your bones and lower your risk for a fracture. Include calcium and vitamin D in your diet. Calcium is important for bone health, and vitamin D helps your body absorb calcium. Talk with your health care provider about screening for osteoporosis if you are a woman who is age 52 or older, or a man who is age 79 or older. This information is not intended to replace advice given to you by your health care provider. Make sure you discuss any questions you have with your health care provider. Document Revised: 09/08/2019 Document Reviewed: 09/08/2019 Elsevier Patient Education  2023 Elsevier Inc.  Denosumab Injection (Osteoporosis) What is this medication? DENOSUMAB (den oh SUE mab) prevents and treats osteoporosis. It  works by Interior and spatial designer stronger and less likely to break (fracture). It is a monoclonal antibody. This medicine may be used for other purposes; ask your health care provider or pharmacist if you have questions. COMMON BRAND NAME(S): Prolia What should I tell my care team before I take this medication? They need to know if you have any of these conditions: Dental or gum disease, or plan to have dental surgery or a tooth pulled Infection Kidney disease Low levels of calcium or vitamin D in your blood On dialysis Poor nutrition Skin conditions Thyroid disease, or have had thyroid or parathyroid surgery Trouble absorbing minerals in your stomach or intestine An unusual or allergic reaction to denosumab, other medications, foods, dyes, or preservatives Pregnant or trying to get pregnant Breastfeeding How should I use this medication? This medication is injected under the skin. It is given by your care team in a hospital or clinic setting. A special MedGuide will be given to you before each treatment. Be sure to read this information carefully each time. Talk to your care team about the use of this medication in children. Special care may be needed. Overdosage: If you think you have taken too much of this medicine contact a poison control center or emergency room at once. NOTE: This medicine is only for you. Do not share this  medicine with others. What if I miss a dose? Keep appointments for follow-up doses. It is important not to miss your dose. Call your care team if you are unable to keep an appointment. What may interact with this medication? Do not take this medication with any of the following: Other medications that contain denosumab This medication may also interact with the following: Medications that lower your chance of fighting infection Steroid medications, such as prednisone or cortisone This list may not describe all possible interactions. Give your health care provider a  list of all the medicines, herbs, non-prescription drugs, or dietary supplements you use. Also tell them if you smoke, drink alcohol, or use illegal drugs. Some items may interact with your medicine. What should I watch for while using this medication? Your condition will be monitored carefully while you are receiving this medication. You may need blood work while taking this medication. This medication may increase your risk of getting an infection. Call your care team for advice if you get a fever, chills, sore throat, or other symptoms of a cold or flu. Do not treat yourself. Try to avoid being around people who are sick. Tell your dentist and dental surgeon that you are taking this medication. You should not have major dental surgery while on this medication. See your dentist to have a dental exam and fix any dental problems before starting this medication. Take good care of your teeth while on this medication. Make sure you see your dentist for regular follow-up appointments. You should make sure you get enough calcium and vitamin D while you are taking this medication. Discuss the foods you eat and the vitamins you take with your care team. Talk to your care team if you are pregnant or think you might be pregnant. This medication can cause serious birth defects if taken during pregnancy and for 5 months after the last dose. You will need a negative pregnancy test before starting this medication. Contraception is recommended while taking this medication and for 5 months after the last dose. Your care team can help you find the option that works for you. Talk to your care team before breastfeeding. Changes to your treatment plan may be needed. What side effects may I notice from receiving this medication? Side effects that you should report to your care team as soon as possible: Allergic reactions--skin rash, itching, hives, swelling of the face, lips, tongue, or throat Infection--fever, chills,  cough, sore throat, wounds that don't heal, pain or trouble when passing urine, general feeling of discomfort or being unwell Low calcium level--muscle pain or cramps, confusion, tingling, or numbness in the hands or feet Osteonecrosis of the jaw--pain, swelling, or redness in the mouth, numbness of the jaw, poor healing after dental work, unusual discharge from the mouth, visible bones in the mouth Severe bone, joint, or muscle pain Skin infection--skin redness, swelling, warmth, or pain Side effects that usually do not require medical attention (report these to your care team if they continue or are bothersome): Back pain Headache Joint pain Muscle pain Pain in the hands, arms, legs, or feet Runny or stuffy nose Sore throat This list may not describe all possible side effects. Call your doctor for medical advice about side effects. You may report side effects to FDA at 1-800-FDA-1088. Where should I keep my medication? This medication is given in a hospital or clinic. It will not be stored at home. NOTE: This sheet is a summary. It may not cover all possible information.  If you have questions about this medicine, talk to your doctor, pharmacist, or health care provider.  2023 Elsevier/Gold Standard (2021-08-05 00:00:00)

## 2022-07-18 NOTE — Progress Notes (Signed)
Message has been sent to the Prior Auth Team and to front [employer].

## 2022-08-05 ENCOUNTER — Encounter: Payer: Self-pay | Admitting: Family Medicine

## 2022-08-12 NOTE — Telephone Encounter (Signed)
Patient called to request an update . Call back number is [phone]

## 2022-08-14 ENCOUNTER — Telehealth: Payer: Self-pay

## 2022-08-14 NOTE — Telephone Encounter (Signed)
Prolia VOB initiated via [email or web address] 

## 2022-08-15 NOTE — Telephone Encounter (Signed)
Pt ready for scheduling for Prolia on or after : 08/15/22  Out-of-pocket cost due at time of visit: $117.81   Primary: Medicare Prolia co-insurance: 0% Admin fee co-insurance: 0%  Secondary: [employer]  (plan does not cover the Medicare Part B deductible) Prolia co-insurance:  Admin fee co-insurance:   Medical Benefit Details: Date Benefits were checked: 08/14/22 Deductible: $122.19 met of $240 required/ Coinsurance: 0%/ Admin Fee: 0%  Prior Auth: N/A PA# Expiration Date:    Pharmacy benefit: Copay $--- If patient wants fill through the pharmacy benefit please send prescription to:  --- , and include estimated need by date in rx notes. Pharmacy will ship medication directly to the office.  Patient NOT eligible for Prolia Copay Card. Copay Card can make patient's cost as little as $25. Link to apply: [email or web address]://[email or web address]  ** This summary of benefits is an estimation of the patient's out-of-pocket cost. Exact cost may very based on individual plan coverage.

## 2022-08-20 DIAGNOSIS — Z1211 Encounter for screening for malignant neoplasm of colon: Secondary | ICD-10-CM | POA: Diagnosis not present

## 2022-08-20 DIAGNOSIS — Z1212 Encounter for screening for malignant neoplasm of rectum: Secondary | ICD-10-CM | POA: Diagnosis not present

## 2022-08-26 ENCOUNTER — Ambulatory Visit (INDEPENDENT_AMBULATORY_CARE_PROVIDER_SITE_OTHER): Payer: Medicare Other

## 2022-08-26 DIAGNOSIS — M810 Age-related osteoporosis without current pathological fracture: Secondary | ICD-10-CM

## 2022-08-26 MED ORDER — DENOSUMAB 60 MG/ML ~~LOC~~ SOSY
60.0000 mg | PREFILLED_SYRINGE | Freq: Once | SUBCUTANEOUS | Status: AC
  Administered 2022-08-26: 60 mg via SUBCUTANEOUS

## 2022-08-26 NOTE — Progress Notes (Cosign Needed Addendum)
Pt is here for prolia injection for Dr. Mardelle Matte. Given in left arm, pt tolerated well.  RETURN: 02/25/2023

## 2022-08-28 LAB — COLOGUARD: COLOGUARD: NEGATIVE

## 2022-08-28 NOTE — Progress Notes (Signed)
Negative cologuard. Mc note sent

## 2022-09-22 ENCOUNTER — Ambulatory Visit (INDEPENDENT_AMBULATORY_CARE_PROVIDER_SITE_OTHER): Payer: Medicare Other

## 2022-09-22 VITALS — Wt 125.8 lb

## 2022-09-22 DIAGNOSIS — Z0000 Encounter for general adult medical examination without abnormal findings: Secondary | ICD-10-CM

## 2022-09-22 NOTE — Patient Instructions (Signed)
Mercedes Briggs , Thank you for taking time to come for your Medicare Wellness Visit. I appreciate your ongoing commitment to your health goals. Please review the following plan we discussed and let me know if I can assist you in the future.   These are the goals we discussed:  Goals      Patient Stated     Stay healthy     Patient Stated     Be more careful        This is a list of the screening recommended for you and due dates:  Health Maintenance  Topic Date Due   DTaP/Tdap/Td vaccine (1 - Tdap) Never done   Zoster (Shingles) Vaccine (1 of 2) Never done   COVID-19 Vaccine (4 - 2023-24 season) 12/06/2021   Flu Shot  11/06/2022   Mammogram  01/31/2023   Medicare Annual Wellness Visit  09/22/2023   DEXA scan (bone density measurement)  07/13/2024   Cologuard (Stool DNA test)  08/19/2025   Pneumonia Vaccine  Completed   Hepatitis C Screening  Completed   HPV Vaccine  Aged Out    Advanced directives: copies in chart   Conditions/risks identified: stay healthy  Next appointment: Follow up in one year for your annual wellness visit    Preventive Care 65 Years and Older, Female Preventive care refers to lifestyle choices and visits with your health care provider that can promote health and wellness. What does preventive care include? A yearly physical exam. This is also called an annual well check. Dental exams once or twice a year. Routine eye exams. Ask your health care provider how often you should have your eyes checked. Personal lifestyle choices, including: Daily care of your teeth and gums. Regular physical activity. Eating a healthy diet. Avoiding tobacco and drug use. Limiting alcohol use. Practicing safe sex. Taking low-dose aspirin every day. Taking vitamin and mineral supplements as recommended by your health care provider. What happens during an annual well check? The services and screenings done by your health care provider during your annual well  check will depend on your age, overall health, lifestyle risk factors, and family history of disease. Counseling  Your health care provider may ask you questions about your: Alcohol use. Tobacco use. Drug use. Emotional well-being. Home and relationship well-being. Sexual activity. Eating habits. History of falls. Memory and ability to understand (cognition). Work and work Astronomer. Reproductive health. Screening  You may have the following tests or measurements: Height, weight, and BMI. Blood pressure. Lipid and cholesterol levels. These may be checked every 5 years, or more frequently if you are over 33 years old. Skin check. Lung cancer screening. You may have this screening every year starting at age 107 if you have a 30-pack-year history of smoking and currently smoke or have quit within the past 15 years. Fecal occult blood test (FOBT) of the stool. You may have this test every year starting at age 51. Flexible sigmoidoscopy or colonoscopy. You may have a sigmoidoscopy every 5 years or a colonoscopy every 10 years starting at age 49. Hepatitis C blood test. Hepatitis B blood test. Sexually transmitted disease (STD) testing. Diabetes screening. This is done by checking your blood sugar (glucose) after you have not eaten for a while (fasting). You may have this done every 1-3 years. Bone density scan. This is done to screen for osteoporosis. You may have this done starting at age 90. Mammogram. This may be done every 1-2 years. Talk to your health care provider  about how often you should have regular mammograms. Talk with your health care provider about your test results, treatment options, and if necessary, the need for more tests. Vaccines  Your health care provider may recommend certain vaccines, such as: Influenza vaccine. This is recommended every year. Tetanus, diphtheria, and acellular pertussis (Tdap, Td) vaccine. You may need a Td booster every 10 years. Zoster  vaccine. You may need this after age 12. Pneumococcal 13-valent conjugate (PCV13) vaccine. One dose is recommended after age 25. Pneumococcal polysaccharide (PPSV23) vaccine. One dose is recommended after age 2. Talk to your health care provider about which screenings and vaccines you need and how often you need them. This information is not intended to replace advice given to you by your health care provider. Make sure you discuss any questions you have with your health care provider. Document Released: 04/20/2015 Document Revised: 12/12/2015 Document Reviewed: 01/23/2015 Elsevier Interactive Patient Education  2017 [employer] Prevention in the Home Falls can cause injuries. They can happen to people of all ages. There are many things you can do to make your home safe and to help prevent falls. What can I do on the outside of my home? Regularly fix the edges of walkways and driveways and fix any cracks. Remove anything that might make you trip as you walk through a door, such as a raised step or threshold. Trim any bushes or trees on the path to your home. Use bright outdoor lighting. Clear any walking paths of anything that might make someone trip, such as rocks or tools. Regularly check to see if handrails are loose or broken. Make sure that both sides of any steps have handrails. Any raised decks and porches should have guardrails on the edges. Have any leaves, snow, or ice cleared regularly. Use sand or salt on walking paths during winter. Clean up any spills in your garage right away. This includes oil or grease spills. What can I do in the bathroom? Use night lights. Install grab bars by the toilet and in the tub and shower. Do not use towel bars as grab bars. Use non-skid mats or decals in the tub or shower. If you need to sit down in the shower, use a plastic, non-slip stool. Keep the floor dry. Clean up any water that spills on the floor as soon as it happens. Remove  soap buildup in the tub or shower regularly. Attach bath mats securely with double-sided non-slip rug tape. Do not have throw rugs and other things on the floor that can make you trip. What can I do in the bedroom? Use night lights. Make sure that you have a light by your bed that is easy to reach. Do not use any sheets or blankets that are too big for your bed. They should not hang down onto the floor. Have a firm chair that has side arms. You can use this for support while you get dressed. Do not have throw rugs and other things on the floor that can make you trip. What can I do in the kitchen? Clean up any spills right away. Avoid walking on wet floors. Keep items that you use a lot in easy-to-reach places. If you need to reach something above you, use a strong step stool that has a grab bar. Keep electrical cords out of the way. Do not use floor polish or wax that makes floors slippery. If you must use wax, use non-skid floor wax. Do not have throw rugs and  other things on the floor that can make you trip. What can I do with my stairs? Do not leave any items on the stairs. Make sure that there are handrails on both sides of the stairs and use them. Fix handrails that are broken or loose. Make sure that handrails are as long as the stairways. Check any carpeting to make sure that it is firmly attached to the stairs. Fix any carpet that is loose or worn. Avoid having throw rugs at the top or bottom of the stairs. If you do have throw rugs, attach them to the floor with carpet tape. Make sure that you have a light switch at the top of the stairs and the bottom of the stairs. If you do not have them, ask someone to add them for you. What else can I do to help prevent falls? Wear shoes that: Do not have high heels. Have rubber bottoms. Are comfortable and fit you well. Are closed at the toe. Do not wear sandals. If you use a stepladder: Make sure that it is fully opened. Do not climb a  closed stepladder. Make sure that both sides of the stepladder are locked into place. Ask someone to hold it for you, if possible. Clearly mark and make sure that you can see: Any grab bars or handrails. First and last steps. Where the edge of each step is. Use tools that help you move around (mobility aids) if they are needed. These include: Canes. Walkers. Scooters. Crutches. Turn on the lights when you go into a dark area. Replace any light bulbs as soon as they burn out. Set up your furniture so you have a clear path. Avoid moving your furniture around. If any of your floors are uneven, fix them. If there are any pets around you, be aware of where they are. Review your medicines with your doctor. Some medicines can make you feel dizzy. This can increase your chance of falling. Ask your doctor what other things that you can do to help prevent falls. This information is not intended to replace advice given to you by your health care provider. Make sure you discuss any questions you have with your health care provider. Document Released: 01/18/2009 Document Revised: 08/30/2015 Document Reviewed: 04/28/2014 Elsevier Interactive Patient Education  2017 [employer]

## 2022-09-22 NOTE — Progress Notes (Signed)
I connected with  Drucilla Dipietrantonio on 09/22/22 by a audio enabled telemedicine application and verified that I am speaking with the correct person using two identifiers.  Patient Location: Home  Provider Location: Office/Clinic  I discussed the limitations of evaluation and management by telemedicine. The patient expressed understanding and agreed to proceed.   Patient Medicare AWV questionnaire was completed by the patient on 09/21/22; I have confirmed that all information answered by patient is correct and no changes since this date.      Subjective:   Mercedes Briggs is a 72 y.o. female who presents for Medicare Annual (Subsequent) preventive examination.  Review of Systems     Cardiac Risk Factors include: advanced age (>70men, >68 women);dyslipidemia;hypertension     Objective:    Today's Vitals   09/22/22 0804  Weight: 125 lb 12.8 oz (57.1 kg)   Body mass index is 21.59 kg/m.     09/22/2022    8:10 AM 09/16/2021    8:10 AM 08/21/2021    1:17 PM 08/05/2021   12:18 PM 07/29/2021   11:44 AM 07/21/2019    8:42 AM  Advanced Directives  Does Patient Have a Medical Advance Directive? Yes Yes Yes Yes Yes Yes  Type of Estate agent of [place];Living will Healthcare Power of [employer] of [place];Living will Healthcare Power of [place];Living will Living will;Healthcare Power of Attorney  Does patient want to make changes to medical advance directive? No - Patient declined   No - Patient declined  No - Patient declined  Copy of Healthcare Power of Attorney in Chart? Yes - validated most recent copy scanned in chart (See row information) Yes - validated most recent copy scanned in chart (See row information) No - copy requested No - copy requested  No - copy requested    Current Medications (verified) Outpatient Encounter Medications as of 09/22/2022  Medication Sig   Ascorbic Acid (VITAMIN C) 1000 MG tablet Take 1,000 mg by mouth  daily. With a meal (lunch or supper)   B Complex-C (B-COMPLEX WITH VITAMIN C) tablet Take 1 tablet by mouth daily. With a meal (lunch or supper)   Cholecalciferol (VITAMIN D-3) 125 MCG (5000 UT) TABS Take 5,000 Units by mouth daily. With a meal (lunch or supper)   Coenzyme Q-10 100 MG capsule Take 100 mg by mouth daily. With a meal (lunch or supper)   CVS TRIPLE MAGNESIUM COMPLEX PO Take 2 capsules by mouth daily.   levothyroxine (SYNTHROID) 100 MCG tablet TAKE 1 TABLET EVERY MORNINGBEFORE BREAKFAST   lisinopril (ZESTRIL) 5 MG tablet TAKE 1 TABLET DAILY   Menaquinone-7 (VITAMIN K2) 100 MCG CAPS Take 100 mcg by mouth daily. With a meal (lunch or supper)   Milk Thistle 1000 MG CAPS Take 1,000 mg by mouth daily. With a meal (lunch or supper)   Omega-3 Fatty Acids (FISH OIL) 1000 MG CAPS Take 2,000 mg by mouth daily. With a meal (lunch or supper)   [DISCONTINUED] ibuprofen (ADVIL) 200 MG tablet Take 600 mg by mouth every 8 (eight) hours as needed (for fracture pain.).   No facility-administered encounter medications on file as of 09/22/2022.    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 Offer treatment.    Herniated disc, cervical    Hyperlipidemia    Hypertension    Hypothyroidism    Past Surgical History:  Procedure  Laterality Date   ABDOMINAL HYSTERECTOMY     ANTERIOR CERVICAL DISCECTOMY     CESAREAN SECTION     OPEN REDUCTION INTERNAL FIXATION (ORIF) DISTAL RADIAL FRACTURE Left 08/05/2021   Procedure: LEFT OPEN REDUCTION INTERNAL FIXATION (ORIF) DISTAL RADIAL FRACTURE;  Surgeon: Marlyne Beards, MD;  Location: [facility] OR;  Service: Orthopedics;  Laterality: Left;   TONSILLECTOMY     Family History  Problem Relation Age of Onset   Alzheimer's disease Mother    Arthritis Mother    Atrial fibrillation Mother    Dementia Mother    Hypertension Mother    Asthma Father     COPD Father    Heart disease Father    Hypertension Father    Diabetes Brother    Neurodegenerative disease Brother        storage cell disease   Social History   Socioeconomic History   Marital status: Married    Spouse name: Not on file   Number of children: Not on file   Years of education: Not on file   Highest education level: Not on file  Occupational History   Occupation: Retired     Comment: Doctor, general practice   Tobacco Use   Smoking status: Never   Smokeless tobacco: Never   Tobacco comments:    occasionally   Vaping Use   Vaping Use: Never used  Substance and Sexual Activity   Alcohol use: Yes    Comment: socially   Drug use: Never   Sexual activity: Yes    Birth control/protection: Post-menopausal  Other Topics Concern   Not on file  Social History Narrative   1 daughter that lives in [place]    Social Determinants of Health   Financial Resource Strain: Low Risk  (09/21/2022)   Overall Financial Resource Strain (CARDIA)    Difficulty of Paying Living Expenses: Not hard at all  Food Insecurity: No Food Insecurity (09/21/2022)   Hunger Vital Sign    Worried About Running Out of Food in the Last Year: Never true    Ran Out of Food in the Last Year: Never true  Transportation Needs: No Transportation Needs (09/21/2022)   PRAPARE - Administrator, Civil Service (Medical): No    Lack of Transportation (Non-Medical): No  Physical Activity: Sufficiently Active (09/21/2022)   Exercise Vital Sign    Days of Exercise per Week: 7 days    Minutes of Exercise per Session: 40 min  Stress: No Stress Concern Present (09/21/2022)   [employer] of Occupational Health - Occupational Stress Questionnaire    Feeling of Stress : Only a little  Social Connections: Moderately Isolated (09/21/2022)   Social Connection and Isolation Panel [NHANES]    Frequency of Communication with Friends and Family: More than three times a week    Frequency of Social  Gatherings with Friends and Family: Once a week    Attends Religious Services: Never    Database administrator or Organizations: No    Attends Engineer, structural: Never    Marital Status: Married    Tobacco Counseling Counseling given: Not Answered Tobacco comments: occasionally    Clinical Intake:  Pre-visit preparation completed: Yes  Pain : No/denies pain     BMI - recorded: 21.59 Nutritional Status: BMI of 19-24  Normal Nutritional Risks: None Diabetes: No  How often do you need to have someone help you when you read instructions, pamphlets, or other written materials from your doctor or pharmacy?: (P) 1 -  Never  Diabetic?no  Interpreter Needed?: No  Information entered by :: Lanier Ensign, LPN   Activities of Daily Living    09/21/2022    6:36 AM  In your present state of health, do you have any difficulty performing the following activities:  Hearing? 0  Vision? 0  Difficulty concentrating or making decisions? 0  Walking or climbing stairs? 0  Dressing or bathing? 0  Doing errands, shopping? 0  Preparing Food and eating ? N  Using the Toilet? N  In the past six months, have you accidently leaked urine? N  Do you have problems with loss of bowel control? N  Managing your Medications? N  Managing your Finances? N  Housekeeping or managing your Housekeeping? N    Patient Care Team: Willow Ora, MD as PCP - General (Family Medicine) Annette Stable, OD (Inactive) as Consulting Physician (Optometry)  Indicate any recent Medical Services you may have received from other than [facility] providers in the past year (date may be approximate).     Assessment:   This is a routine wellness examination for Mercedes Briggs.  Hearing/Vision screen Hearing Screening - Comments:: Pt denies any hearing issues  Vision Screening - Comments:: Pt follows up with [facility] for annual eye exams   Dietary issues and exercise activities discussed: Current Exercise  Habits: Home exercise routine, Type of exercise: walking, Time (Minutes): 40, Frequency (Times/Week): 7, Weekly Exercise (Minutes/Week): 280   Goals Addressed             This Visit's Progress    Patient Stated       Stay healthy        Depression Screen    09/22/2022    8:08 AM 07/18/2022   11:23 AM 01/21/2022    8:29 AM 09/16/2021    8:09 AM 08/27/2020    1:57 PM 07/21/2019    8:44 AM 12/20/2018   10:31 AM  PHQ 2/9 Scores  PHQ - 2 Score 0 0 0 0 1 0 0    Fall Risk    09/21/2022    6:36 AM 07/18/2022   11:23 AM 01/21/2022    8:29 AM 09/16/2021    8:12 AM 08/27/2020    2:01 PM  Fall Risk   Falls in the past year? 1 0 1 1 0  Number falls in past yr: 0 0 1 1 0  Injury with Fall? 0 0 1 1 0  Comment    related to raised side walk, left shoulder , hand   Risk for fall due to : Impaired vision;Impaired balance/gait No Fall Risks  Impaired vision;Impaired balance/gait Impaired vision  Follow up Falls prevention discussed Falls evaluation completed Falls evaluation completed Falls prevention discussed Falls prevention discussed    FALL RISK PREVENTION PERTAINING TO THE HOME:  Any stairs in or around the home? Yes  If so, are there any without handrails? No  Home free of loose throw rugs in walkways, pet beds, electrical cords, etc? Yes  Adequate lighting in your home to reduce risk of falls? Yes   ASSISTIVE DEVICES UTILIZED TO PREVENT FALLS:  Life alert? No  Use of a cane, walker or w/c? No  Grab bars in the bathroom? Yes  Shower chair or bench in shower? Yes  Elevated toilet seat or a handicapped toilet? Yes   TIMED UP AND GO:  Was the test performed? No    Cognitive Function:        09/22/2022    8:12 AM  08/27/2020    2:03 PM 07/21/2019    8:43 AM  6CIT Screen  What Year? 0 points 0 points 0 points  What month? 0 points 0 points 0 points  What time? 0 points  0 points  Count back from 20 0 points 0 points 0 points  Months in reverse 0 points 0 points 0 points   Repeat phrase 0 points 0 points 0 points  Total Score 0 points  0 points    Immunizations Immunization History  Administered Date(s) Administered   Fluad Quad(high Dose 65+) 01/21/2022   Influenza, High Dose Seasonal PF 04/19/2016   Moderna Sars-Covid-2 Vaccination 05/20/2019, 06/17/2019, 02/07/2020   Pneumococcal Conjugate-13 04/19/2016   Pneumococcal Polysaccharide-23 12/14/2017   Zoster Recombinat (Shingrix) 02/03/2022, 04/11/2022   Zoster, Live 12/06/2015      Flu Vaccine status: Up to date  Pneumococcal vaccine status: Up to date  Covid-19 vaccine status: Completed vaccines  Qualifies for Shingles Vaccine? Yes   Zostavax completed Yes   Shingrix Completed?: Yes  Screening Tests Health Maintenance  Topic Date Due   DTaP/Tdap/Td (1 - Tdap) Never done   COVID-19 Vaccine (4 - 2023-24 season) 12/06/2021   INFLUENZA VACCINE  11/06/2022   MAMMOGRAM  01/31/2023   Medicare Annual Wellness (AWV)  09/22/2023   DEXA SCAN  07/13/2024   Fecal DNA (Cologuard)  08/19/2025   Pneumonia Vaccine 66+ Years old  Completed   Hepatitis C Screening  Completed   Zoster Vaccines- Shingrix  Completed   HPV VACCINES  Aged Out    Health Maintenance  Health Maintenance Due  Topic Date Due   DTaP/Tdap/Td (1 - Tdap) Never done   COVID-19 Vaccine (4 - 2023-24 season) 12/06/2021    Colorectal cancer screening: Type of screening: Cologuard. Completed 08/20/22. Repeat every 3 years  Mammogram status: Completed 01/30/22. Repeat every year  Bone Density status: Completed 5//15/24. Results reflect: Bone density results: OSTEOPOROSIS. Repeat every 2 years.  Additional Screening:  Hepatitis C Screening: Completed 09/26/19  Vision Screening: Recommended annual ophthalmology exams for early detection of glaucoma and other disorders of the eye. Is the patient up to date with their annual eye exam?  Yes  Who is the provider or what is the name of the office in which the patient attends annual  eye exams? [facility]  If pt is not established with a provider, would they like to be referred to a provider to establish care? No .   Dental Screening: Recommended annual dental exams for proper oral hygiene  Community Resource Referral / Chronic Care Management: CRR required this visit?  No   CCM required this visit?  No      Plan:     I have personally reviewed and noted the following in the patient's chart:   Medical and social history Use of alcohol, tobacco or illicit drugs  Current medications and supplements including opioid prescriptions. Patient is not currently taking opioid prescriptions. Functional ability and status Nutritional status Physical activity Advanced directives List of other physicians Hospitalizations, surgeries, and ER visits in previous 12 months Vitals Screenings to include cognitive, depression, and falls Referrals and appointments  In addition, I have reviewed and discussed with patient certain preventive protocols, quality metrics, and best practice recommendations. A written personalized care plan for preventive services as well as general preventive health recommendations were provided to patient.     [name], LPN   [id number]   Nurse Notes: none

## 2022-10-10 IMAGING — CR DG WRIST COMPLETE 3+V*R*
4 series · 4 of 4 positions shown · non-contrast
Comparison: None.

CLINICAL DATA: Fall, injury

EXAM:
RIGHT WRIST - COMPLETE 3+ VIEW

[x wrist pa right]
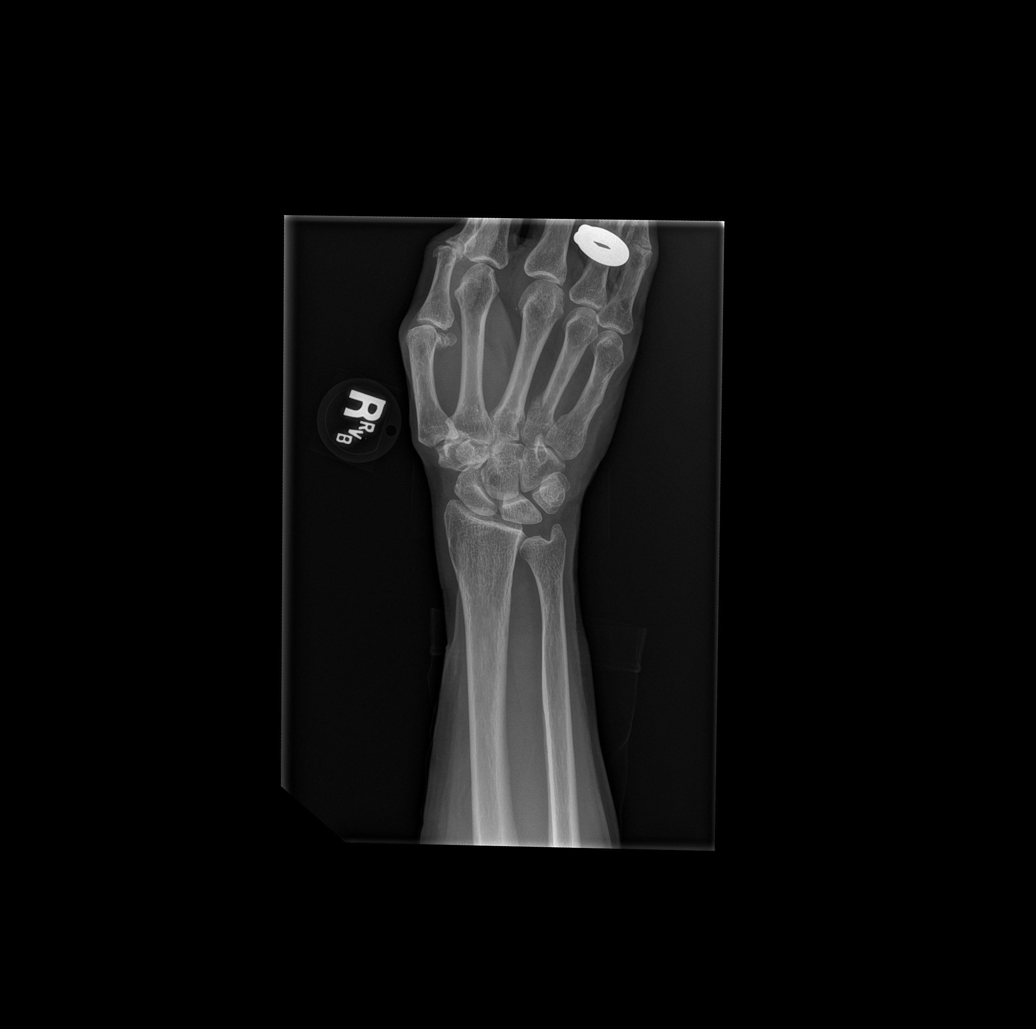

[x wrist obl right]
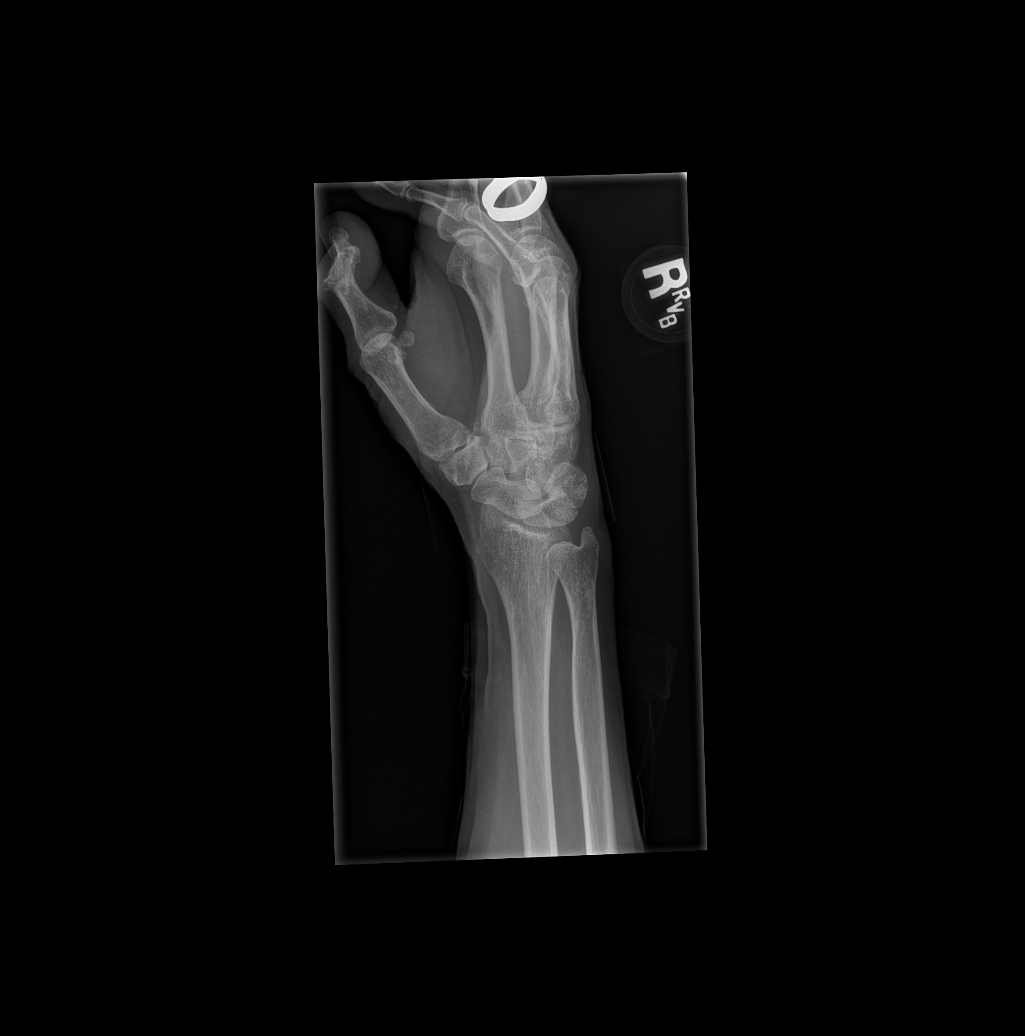

[x wrist lat right]
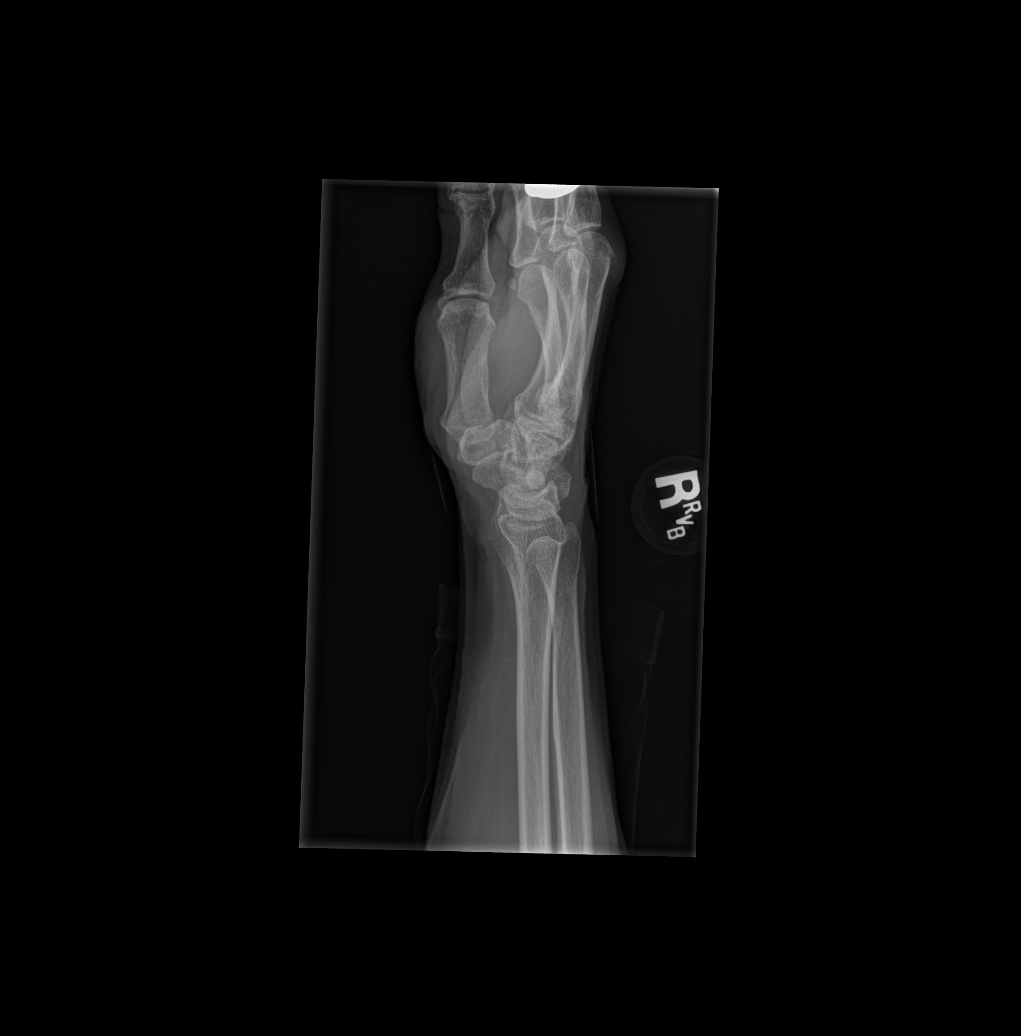

[x wrist navicular view right]
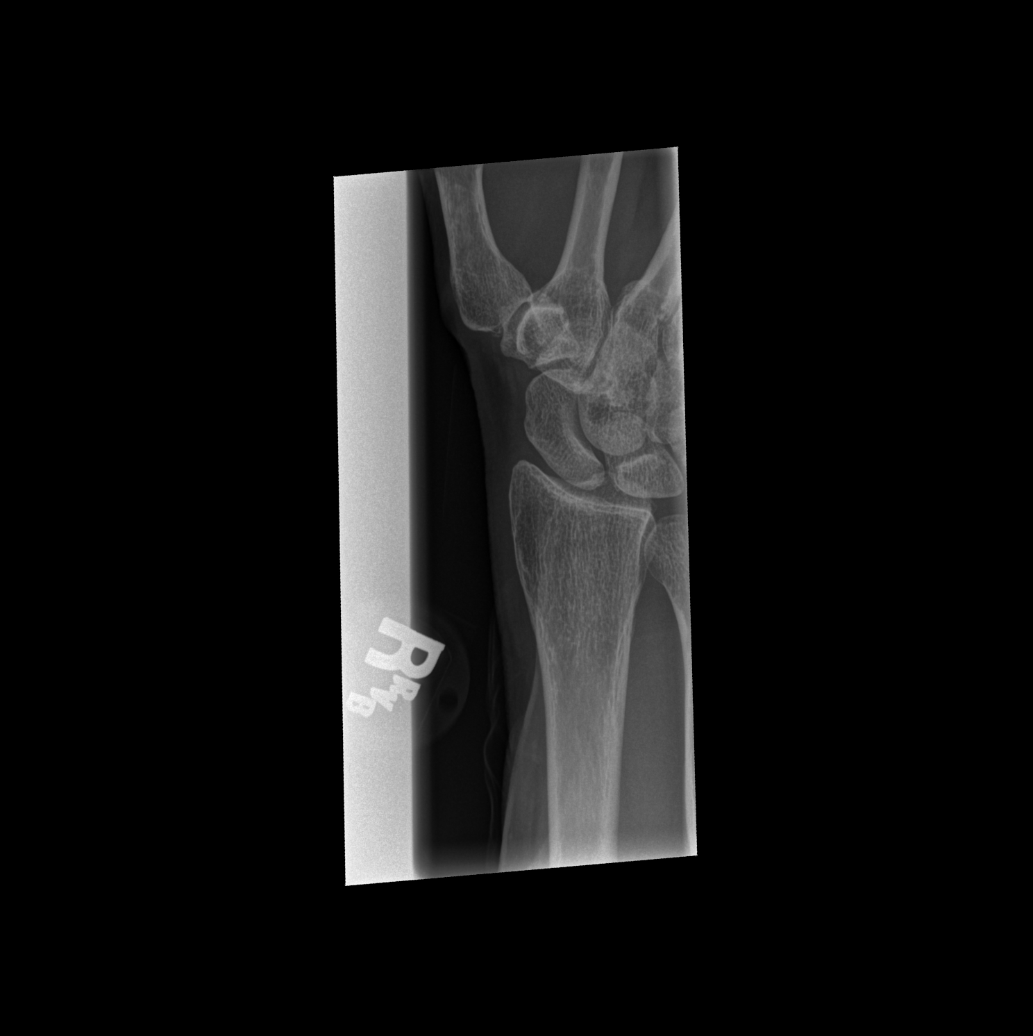

[4 of 4 positions shown; findings below may reference images not displayed]

FINDINGS: There is no evidence of fracture or dislocation. There is no
evidence of arthropathy or other focal bone abnormality. Soft
tissues are unremarkable.
IMPRESSION: No fracture or dislocation of the right wrist. The carpus is
normally aligned.

## 2022-10-10 IMAGING — CR DG WRIST COMPLETE 3+V*L*
4 series · 4 of 4 positions shown · non-contrast
Comparison: None.

CLINICAL DATA: Fall, left worse than right wrist pain

EXAM:
LEFT WRIST - COMPLETE 3+ VIEW

[x wrist pa left]
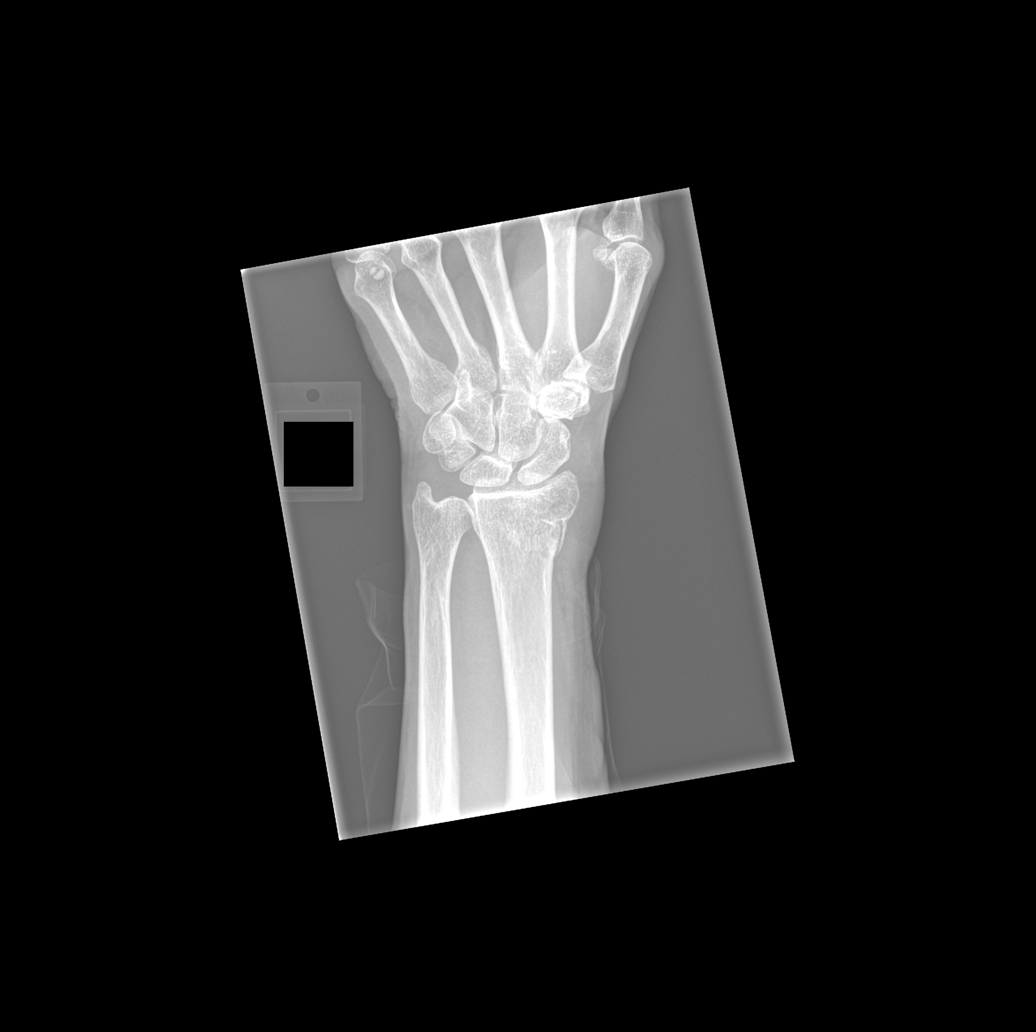

[x wrist obl left]
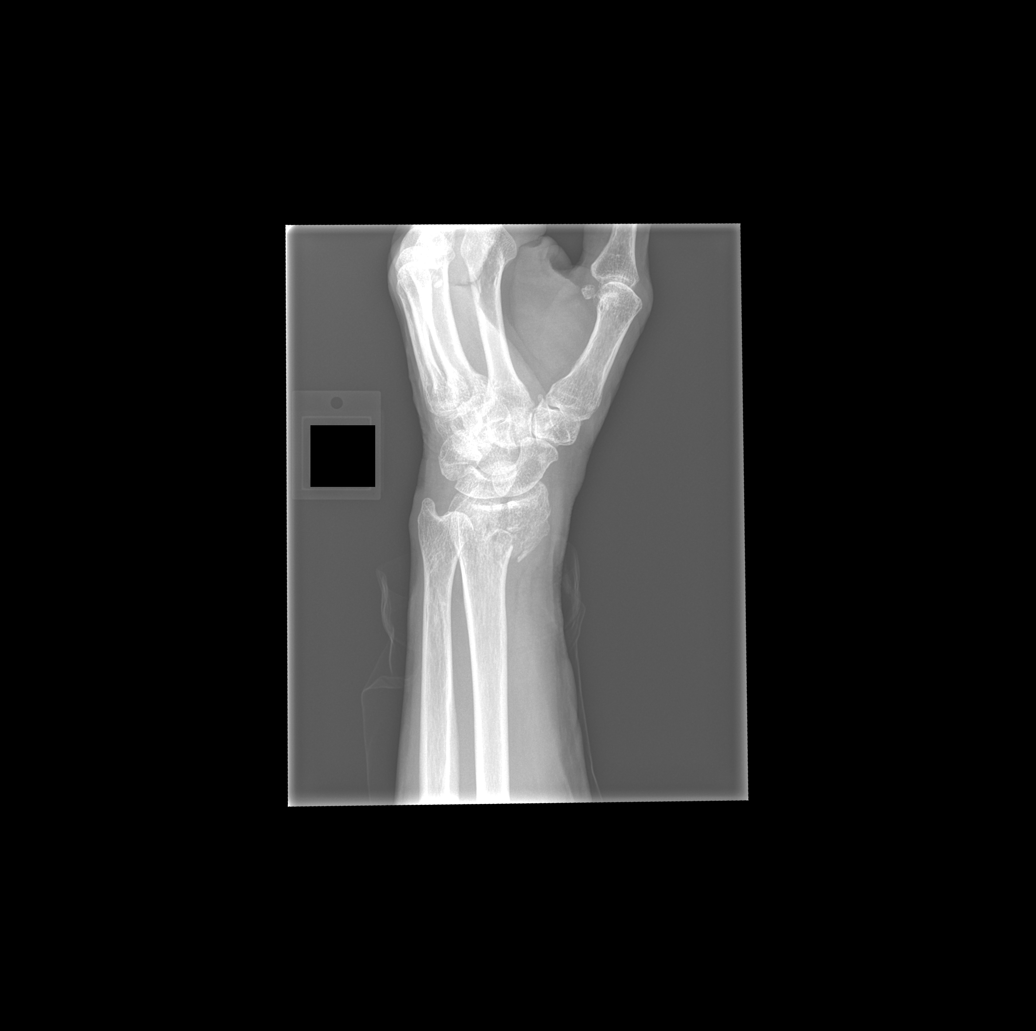

[x wrist lat left]
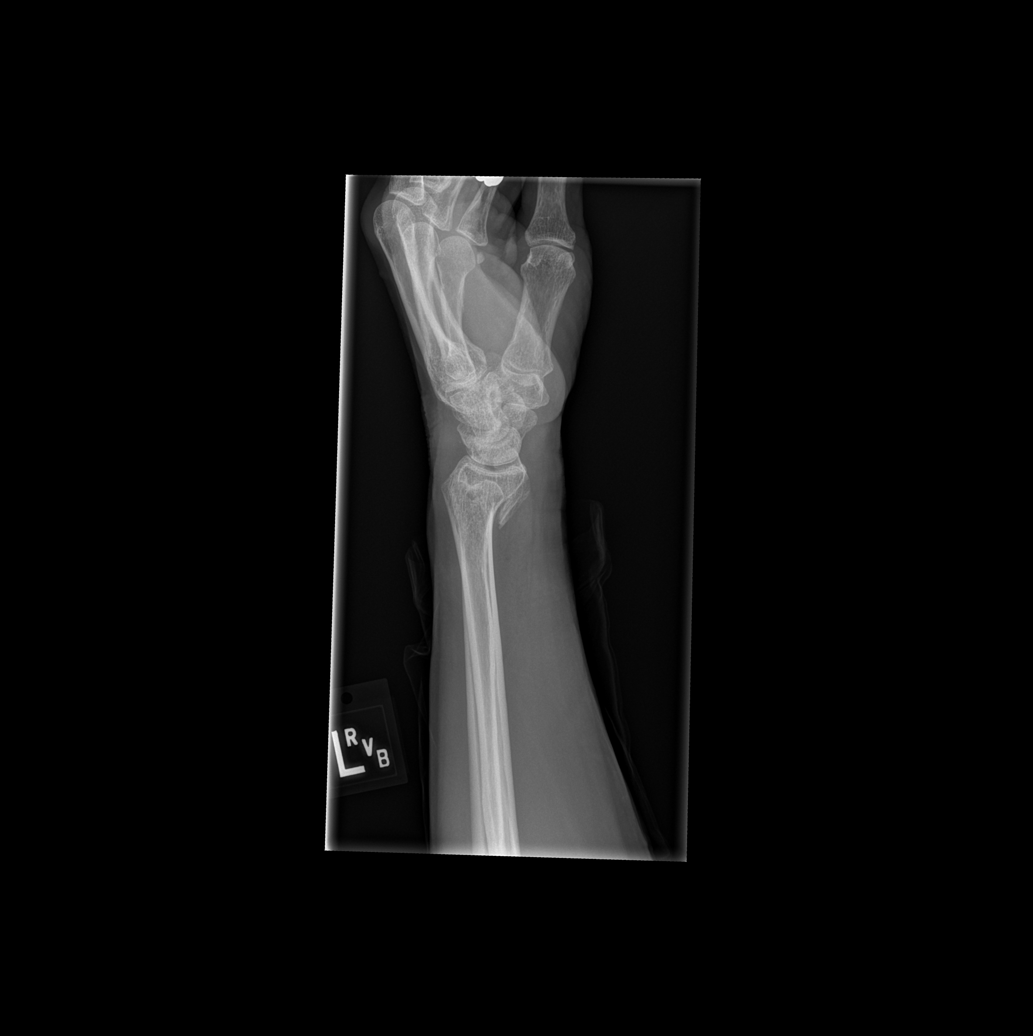

[x wrist navicular view left]
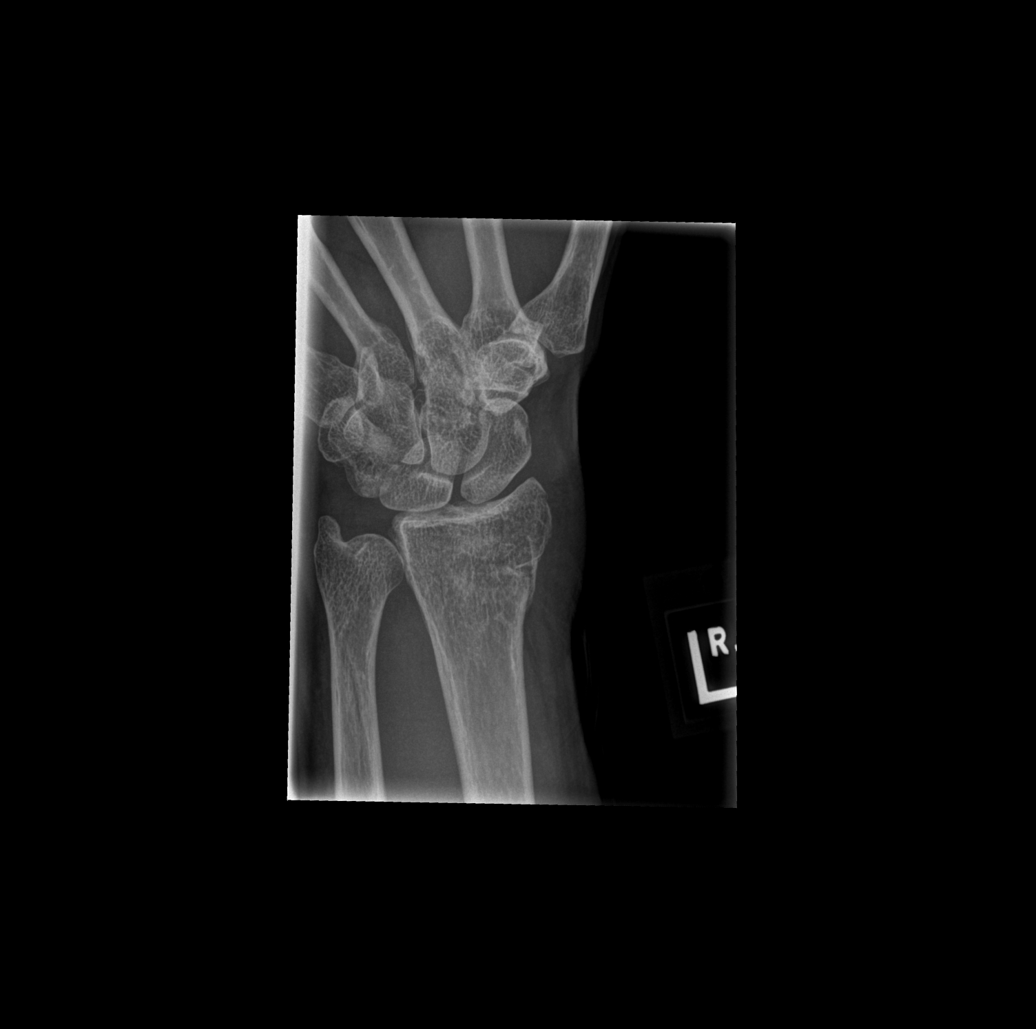

[4 of 4 positions shown; findings below may reference images not displayed]

FINDINGS: There is an impacted mildly displaced fracture of the distal radius
with probable intra-articular extension. There is approximately 3 mm
anterior displacement and mild anterior angulation of the dominant
distal fragment. Radiocarpal alignment is maintained. No ulnar
styloid fracture is seen. There is mild overlying soft tissue
swelling.
IMPRESSION: Mildly displaced and angulated impacted fracture of the distal
radius with likely intra-articular extension.

## 2022-10-19 ENCOUNTER — Other Ambulatory Visit: Payer: Self-pay

## 2022-10-19 ENCOUNTER — Encounter (HOSPITAL_COMMUNITY): Payer: Self-pay

## 2022-10-19 ENCOUNTER — Ambulatory Visit (HOSPITAL_COMMUNITY)
Admission: RE | Admit: 2022-10-19 | Discharge: 2022-10-19 | Disposition: A | Discharge status: Home / Self Care | Payer: Medicare Other | Source: Ambulatory Visit | Attending: Emergency Medicine | Admitting: Emergency Medicine

## 2022-10-19 VITALS — BP 128/71 | HR 80 | Temp 99.0°F | Resp 18

## 2022-10-19 DIAGNOSIS — N390 Urinary tract infection, site not specified: Secondary | ICD-10-CM | POA: Diagnosis not present

## 2022-10-19 LAB — POCT URINALYSIS DIP (MANUAL ENTRY)
Bilirubin, UA: NEGATIVE
Glucose, UA: 100 mg/dL — AB
Nitrite, UA: POSITIVE — AB
Protein Ur, POC: NEGATIVE mg/dL
Spec Grav, UA: <=1.005 — AB (ref 1.010–1.025)
Urobilinogen, UA: 0.2 E.U./dL
pH, UA: 6 (ref 5.0–8.0)

## 2022-10-19 MED ORDER — SULFAMETHOXAZOLE-TRIMETHOPRIM 800-160 MG PO TABS: 1.0000 | ORAL_TABLET | Freq: Two times a day (BID) | ORAL | 0 refills | Status: AC

## 2022-10-19 NOTE — ED Triage Notes (Signed)
Concerned for uti symptoms.  Patient has urgency, burning, frequency.  Patient started feeling bad on Thursday with flank pain, abdominal pressure.  Then the urgency, frequency symptoms followed.    Patient has taken OTC med "that makes urine orange".  Patient has been on this medicine yesterday.  2 doses yesterday and one this morning.  Took D mannose.

## 2022-10-19 NOTE — ED Provider Notes (Signed)
[facility]    CSN: [id number] Arrival date & time: 10/19/22  1016      History   Chief Complaint Chief Complaint  Patient presents with   Urinary Frequency    Classic VERY  uncomfortable symptoms of UTI-urgency, burning, frequency, - Entered by patient    HPI Mercedes Briggs is a 72 y.o. female.   72 year old female, Mercedes Briggs, presents to ER for WGN:FAOZHYQ,MVHQIONGE, burning x 3 days.  Has a past history of UTIs "the last one was when I was in the 30s".  Patient states she has been taking Azo for symptom management without relief.  Patient denies any fever, abdominal pain ,nausea,vomiting or back pain  The history is provided by the patient. No language interpreter was used.    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 Offer treatment.    Herniated disc, cervical    Hyperlipidemia    Hypertension    Hypothyroidism     Patient Active Problem List   Diagnosis Date Noted   Acute UTI 10/19/2022   Closed fracture of left distal radius 07/30/2021   Age related osteoporosis 11/28/2019   Refusal of statin medication by patient 09/26/2019   Acquired hypothyroidism 12/06/2015   White coat syndrome with diagnosis of hypertension 12/06/2015   History of fusion of cervical spine 12/06/2015   Mixed hyperlipidemia 12/06/2015    Past Surgical History:  Procedure Laterality Date   ABDOMINAL HYSTERECTOMY     ANTERIOR CERVICAL DISCECTOMY     CESAREAN SECTION     OPEN REDUCTION INTERNAL FIXATION (ORIF) DISTAL RADIAL FRACTURE Left 08/05/2021   Procedure: LEFT OPEN REDUCTION INTERNAL FIXATION (ORIF) DISTAL RADIAL FRACTURE;  Surgeon: Marlyne Beards, MD;  Location: [facility] OR;  Service: Orthopedics;  Laterality: Left;   TONSILLECTOMY      OB History   No obstetric history on file.      Home Medications    Prior to Admission medications   Medication Sig Start Date End Date  Taking? Authorizing Provider  sulfamethoxazole-trimethoprim (BACTRIM DS) 800-160 MG tablet Take 1 tablet by mouth 2 (two) times daily for 7 days. 10/19/22 10/26/22 Yes Leray Garverick, Para March, NP  Ascorbic Acid (VITAMIN C) 1000 MG tablet Take 1,000 mg by mouth daily. With a meal (lunch or supper)    [provider]  B Complex-C (B-COMPLEX WITH VITAMIN C) tablet Take 1 tablet by mouth daily. With a meal (lunch or supper)    [provider]  Cholecalciferol (VITAMIN D-3) 125 MCG (5000 UT) TABS Take 5,000 Units by mouth daily. With a meal (lunch or supper)    [provider]  Coenzyme Q-10 100 MG capsule Take 100 mg by mouth daily. With a meal (lunch or supper)    [provider]  CVS TRIPLE MAGNESIUM COMPLEX PO Take 2 capsules by mouth daily.    [provider]  levothyroxine (SYNTHROID) 100 MCG tablet TAKE 1 TABLET EVERY MORNINGBEFORE BREAKFAST 05/20/22   Willow Ora, MD  lisinopril (ZESTRIL) 5 MG tablet TAKE 1 TABLET DAILY 05/20/22   Willow Ora, MD  Menaquinone-7 (VITAMIN K2) 100 MCG CAPS Take 100 mcg by mouth daily. With a meal (lunch or supper)    [provider]  Milk Thistle 1000 MG CAPS Take 1,000 mg by mouth daily. With a meal (lunch or supper)    [provider]  Omega-3 Fatty Acids (FISH OIL) 1000 MG CAPS Take 2,000  mg by mouth daily. With a meal (lunch or supper)    [provider]    Family History Family History  Problem Relation Age of Onset   Alzheimer's disease Mother    Arthritis Mother    Atrial fibrillation Mother    Dementia Mother    Hypertension Mother    Asthma Father    COPD Father    Heart disease Father    Hypertension Father    Diabetes Brother    Neurodegenerative disease Brother        storage cell disease    Social History Social History   Tobacco Use   Smoking status: Never   Smokeless tobacco: Never   Tobacco comments:    occasionally   Vaping Use   Vaping status: Never Used   Substance Use Topics   Alcohol use: Yes    Comment: socially   Drug use: Never     Allergies   Penicillins   Review of Systems Review of Systems  Constitutional:  Negative for fever.  Gastrointestinal:  Negative for abdominal pain.  Genitourinary:  Positive for dysuria, frequency and urgency. Negative for flank pain.  Musculoskeletal:  Negative for back pain.  All other systems reviewed and are negative.    Physical Exam Triage Vital Signs ED Triage Vitals [10/19/22 1033]  Encounter Vitals Group     BP      Systolic BP Percentile      Diastolic BP Percentile      Pulse      Resp      Temp      Temp src      SpO2      Weight      Height      Head Circumference      Peak Flow      Pain Score 3     Pain Loc      Pain Education      Exclude from Growth Chart    No data found.  Updated Vital Signs BP 128/71 (BP Location: Left Arm)   Pulse 80   Temp 99 F (37.2 C) (Oral)   Resp 18   SpO2 97%   Visual Acuity Right Eye Distance:   Left Eye Distance:   Bilateral Distance:    Right Eye Near:   Left Eye Near:    Bilateral Near:     Physical Exam Vitals and nursing note reviewed.  Constitutional:      General: She is not in acute distress.    Appearance: She is well-developed.  HENT:     Head: Normocephalic and atraumatic.  Eyes:     Conjunctiva/sclera: Conjunctivae normal.  Cardiovascular:     Rate and Rhythm: Normal rate and regular rhythm.     Heart sounds: No murmur heard. Pulmonary:     Effort: Pulmonary effort is normal. No respiratory distress.     Breath sounds: Normal breath sounds.  Abdominal:     Palpations: Abdomen is soft.     Tenderness: There is no abdominal tenderness.  Musculoskeletal:        General: No swelling.     Cervical back: Neck supple.     Comments: Negative CVAT bilaterally  Skin:    General: Skin is warm and dry.     Capillary Refill: Capillary refill takes less than 2 seconds.  Neurological:     General: No  focal deficit present.     Mental Status: She is alert and oriented to person, place,  and time.     GCS: GCS eye subscore is 4. GCS verbal subscore is 5. GCS motor subscore is 6.  Psychiatric:        Attention and Perception: Attention normal.        Mood and Affect: Mood normal.        Speech: Speech normal.        Behavior: Behavior normal.      UC Treatments / Results  Labs (all labs ordered are listed, but only abnormal results are displayed) Labs Reviewed  POCT URINALYSIS DIP (MANUAL ENTRY) - Abnormal; Notable for the following components:      Result Value   Color, UA straw (*)    Glucose, UA =100 (*)    Ketones, POC UA small (15) (*)    Spec Grav, UA <=1.005 (*)    Blood, UA small (*)    Nitrite, UA Positive (*)    Leukocytes, UA Moderate (2+) (*)    All other components within normal limits  URINE CULTURE    EKG   Radiology No results found.  Procedures Procedures (including critical care time)  Medications Ordered in UC Medications - No data to display  Initial Impression / Assessment and Plan / UC Course  I have reviewed the triage vital signs and the nursing notes.  Pertinent labs & imaging results that were available during my care of the patient were reviewed by me and considered in my medical decision making (see chart for details).  Clinical Course as of 10/19/22 1840  Sun Oct 19, 2022  1050 Will treat for UTi, culture pending [JD]    Clinical Course User Index [JD] Arianna Delsanto, Para March, NP    Ddx: Acute UTI, kidney stone, pyelonephritis Final Clinical Impressions(s) / UC Diagnoses   Final diagnoses:  Acute UTI     Discharge Instructions      Rest, push fluids, take antibiotic as directed.  If you develop fever, nausea, vomiting, unable to keep medications down, or worsening symptoms go immediately to the emergency room for further evaluation(labs,IV abx, imaging,etc).       ED Prescriptions     Medication Sig Dispense Auth.  Provider   sulfamethoxazole-trimethoprim (BACTRIM DS) 800-160 MG tablet Take 1 tablet by mouth 2 (two) times daily for 7 days. 14 tablet Juliane Guest, Para March, NP      PDMP not reviewed this encounter.   Clancy Gourd, NP 10/19/22 1840

## 2022-10-19 NOTE — Discharge Instructions (Addendum)
Rest, push fluids, take antibiotic as directed.  If you develop fever, nausea, vomiting, unable to keep medications down, or worsening symptoms go immediately to the emergency room for further evaluation(labs,IV abx, imaging,etc).

## 2022-10-20 LAB — URINE CULTURE

## 2022-10-21 LAB — URINE CULTURE: Culture: 70000 — AB

## 2022-11-09 ENCOUNTER — Other Ambulatory Visit: Payer: Self-pay | Admitting: Family Medicine

## 2023-03-02 ENCOUNTER — Other Ambulatory Visit (HOSPITAL_COMMUNITY): Payer: Self-pay

## 2023-03-02 ENCOUNTER — Telehealth: Payer: Self-pay

## 2023-03-02 ENCOUNTER — Telehealth: Payer: Self-pay | Admitting: Family Medicine

## 2023-03-02 NOTE — Telephone Encounter (Signed)
Called and informed pt of authorization below. 2nd prolia injection will be given at 03/12/23 OV with PCP.

## 2023-03-02 NOTE — Telephone Encounter (Signed)
Pt ready for scheduling for Prolia on or after : 03/02/23  Out-of-pocket cost due at time of visit: $0  Primary: [facility] Prolia co-insurance: 0% Admin fee co-insurance: 0%  Secondary: [employer] - MEDSUP Prolia co-insurance: 100% Admin fee co-insurance: 100%  Medical Benefit Details: Date Benefits were checked: 03/02/23 Deductible: $240 (met)/ Coinsurance: 100%/ Admin Fee: 100%  Prior Auth: not required PA# Expiration Date:   # of doses approved:  Pharmacy benefit: Copay $-- If patient wants fill through the pharmacy benefit please send prescription to:  -- , and include estimated need by date in rx notes. Pharmacy will ship medication directly to the office.  Patient not eligible for Prolia Copay Card. Copay Card can make patient's cost as little as $25. Link to apply: [email or web address]://www.amgensupportplus.com/copay  ** This summary of benefits is an estimation of the patient's out-of-pocket cost. Exact cost may very based on individual plan coverage.

## 2023-03-02 NOTE — Telephone Encounter (Signed)
Noted  

## 2023-03-02 NOTE — Telephone Encounter (Signed)
Prolia VOB initiated via [email or web address]  Next Prolia inj DUE:  02/2023

## 2023-03-02 NOTE — Telephone Encounter (Signed)
Patient called wanting to get scheduled for her 2nd prolia injection. States her first one was in May of this year. Can we double check authorization?

## 2023-03-12 ENCOUNTER — Ambulatory Visit: Payer: Medicare Other | Admitting: Family Medicine

## 2023-03-12 ENCOUNTER — Encounter: Payer: Self-pay | Admitting: Family Medicine

## 2023-03-12 VITALS — BP 120/68 | HR 73 | Temp 98.0°F | Ht 64.0 in | Wt 130.2 lb

## 2023-03-12 DIAGNOSIS — F4322 Adjustment disorder with anxiety: Secondary | ICD-10-CM

## 2023-03-12 DIAGNOSIS — I10 Essential (primary) hypertension: Secondary | ICD-10-CM

## 2023-03-12 DIAGNOSIS — M810 Age-related osteoporosis without current pathological fracture: Secondary | ICD-10-CM

## 2023-03-12 DIAGNOSIS — Z23 Encounter for immunization: Secondary | ICD-10-CM | POA: Diagnosis not present

## 2023-03-12 DIAGNOSIS — E039 Hypothyroidism, unspecified: Secondary | ICD-10-CM

## 2023-03-12 DIAGNOSIS — Z1231 Encounter for screening mammogram for malignant neoplasm of breast: Secondary | ICD-10-CM

## 2023-03-12 LAB — CBC WITH DIFFERENTIAL/PLATELET
Basophils Absolute: 0 10*3/uL (ref 0.0–0.1)
Basophils Relative: 0.6 % (ref 0.0–3.0)
Eosinophils Absolute: 0.1 10*3/uL (ref 0.0–0.7)
Eosinophils Relative: 1.4 % (ref 0.0–5.0)
HCT: 44.5 % (ref 36.0–46.0)
Hemoglobin: 15.7 g/dL — ABNORMAL HIGH (ref 12.0–15.0)
Lymphocytes Relative: 34.5 % (ref 12.0–46.0)
Lymphs Abs: 2.3 10*3/uL (ref 0.7–4.0)
MCHC: 35.3 g/dL (ref 30.0–36.0)
MCV: 97.7 fL (ref 78.0–100.0)
Monocytes Absolute: 0.5 10*3/uL (ref 0.1–1.0)
Monocytes Relative: 8.2 % (ref 3.0–12.0)
Neutro Abs: 3.7 10*3/uL (ref 1.4–7.7)
Neutrophils Relative %: 55.3 % (ref 43.0–77.0)
Platelets: 262 10*3/uL (ref 150.0–400.0)
RBC: 4.55 Mil/uL (ref 3.87–5.11)
RDW: 12.4 % (ref 11.5–15.5)
WBC: 6.7 10*3/uL (ref 4.0–10.5)

## 2023-03-12 LAB — COMPREHENSIVE METABOLIC PANEL
ALT: 19 U/L (ref 0–35)
AST: 24 U/L (ref 0–37)
Albumin: 4.4 g/dL (ref 3.5–5.2)
Alkaline Phosphatase: 48 U/L (ref 39–117)
BUN: 10 mg/dL (ref 6–23)
CO2: 29 meq/L (ref 19–32)
Calcium: 9.7 mg/dL (ref 8.4–10.5)
Chloride: 101 meq/L (ref 96–112)
Creatinine, Ser: 0.68 mg/dL (ref 0.40–1.20)
GFR: 87.28 mL/min (ref >60.00)
Glucose, Bld: 101 mg/dL — ABNORMAL HIGH (ref 70–99)
Potassium: 4.1 meq/L (ref 3.5–5.1)
Sodium: 137 meq/L (ref 135–145)
Total Bilirubin: 0.6 mg/dL (ref 0.2–1.2)
Total Protein: 6.5 g/dL (ref 6.0–8.3)

## 2023-03-12 LAB — VITAMIN D 25 HYDROXY (VIT D DEFICIENCY, FRACTURES): VITD: 89.34 ng/mL (ref 30.00–100.00)

## 2023-03-12 LAB — TSH: TSH: 1.13 u[IU]/mL (ref 0.35–5.50)

## 2023-03-12 MED ORDER — DENOSUMAB 60 MG/ML ~~LOC~~ SOSY
60.0000 mg | PREFILLED_SYRINGE | Freq: Once | SUBCUTANEOUS | Status: AC
  Administered 2023-03-12: 60 mg via SUBCUTANEOUS

## 2023-03-12 MED ORDER — BUPROPION HCL ER (XL) 150 MG PO TB24: 150.0000 mg | ORAL_TABLET | Freq: Every day | ORAL | 3 refills | Status: DC

## 2023-03-12 NOTE — Progress Notes (Signed)
Subjective  CC:  Chief Complaint  Patient presents with   Acquired hypothyroidism   Hypertension    HPI: Mercedes Briggs is a 72 y.o. female who presents to the office today to address the problems listed above in the chief complaint. Discussed the use of AI scribe software for clinical note transcription with the patient, who gave verbal consent to proceed.  History of Present Illness   The patient, a 72 year old with a history of hypertension and hypothyroidism, presents with concerns about multiple symptoms that she believes may be related to her thyroid medication, levothyroxine. The patient reports a weight loss of 25 pounds over the past ten years, during which time the dosage of levothyroxine has remained unchanged. She expresses concern about hair loss, anxiety, jitteriness, and insomnia, which she has been experiencing recently. The patient also reports a sensation of constant movement, likened to being a "border collie."  In addition to these symptoms, the patient has been experiencing ocular issues, including double vision and a sensation of excessively wet eyes, despite no observable tears. She has been prescribed compound prisms for her glasses to correct the double vision. The patient also reports a history of dry, itchy eyes, which have been managed with an over-the-counter lubricant.  The patient also mentions a recent urinary tract infection (UTI), which she has not experienced in 25 years. She expresses concern about a potential link between the UTI and her Prolia injection, as she noticed that UTI is listed as a potential side effect of the medication.  The patient acknowledges a high level of stress and worry, largely related to current world events. She reports using various coping mechanisms, including reading, writing, praying, and volunteering. Despite these efforts, the patient admits to a lack of inner peace and a constant state of worry. She has a history of  using Paxil and Wellbutrin during menopause, which she reports helped her manage her symptoms at the time.     HTN w/ white coat: home log shows all normal readings: avg 110s/120s-60s/70s on lisinopril 5mg  daily. Tolerates well. No cp or sob. No sweats.   Osteoporosis: Here today for her second dose of Prolia.  She tolerated the first very well.  I reviewed her bone density from earlier this year.  Osteoporosis with lowest T- 2.9.  Does have a history of wrist fracture.  HM: overdue for mammogram.  Assessment  1. Acquired hypothyroidism   2. Need for influenza vaccination   3. White coat syndrome with diagnosis of hypertension   4. Age-related osteoporosis without current pathological fracture   5. Adjustment reaction with anxiety   6. Screening mammogram for breast cancer      Plan   Assessment and Plan    Hypothyroidism Symptoms potentially related to thyroid dysfunction, including hair loss, anxiety, jitteriness, sleep disturbances, and itchy eyes. On a stable dose of levothyroxine for ten years, with well-managed thyroid levels as of April. Symptoms may not be solely due to thyroid issues but will recheck thyroid levels to confirm. Discussed potential hypothyroidism symptoms if medication is stopped, such as weight gain, fatigue, and skin changes. - Order thyroid function tests, TSH - Monitor symptoms and reassess if thyroid levels are normal -Continue levothyroxine 100 mcg daily.  Generalized Anxiety Disorder Significant anxiety, sleep disturbances, and emotional distress, exacerbated by current events and personal stressors. Previously used Paxil and Wellbutrin for mood stabilization during menopause, with good tolerance to Wellbutrin. Discussed restarting Wellbutrin to manage anxiety and improve overall well-being.  Counseling done -  Start Wellbutrin at the lowest dose, Wellbutrin XL1 150 mg daily.  Education given. - Follow up in three months to assess response and adjust  treatment as needed  Hypertension with whitecoat syndrome Monitors blood pressure regularly and shows normal/good control - Continue current antihypertensive regimen, lisinopril 5 mg daily - Monitor blood pressure regularly at home -Check renal function electrolytes -No longer monitoring lipids, show strong feelings about avoiding lipid-lowering medications.  Telogen Effluvium, possible versus normal aging/female pattern hair loss - Monitor hair loss - Reassess if symptoms persist or worsen  Pruritus Generalized itching without rash, potentially related to stress or allergies. Consider liver function and gallbladder issues as potential causes. Discussed the use of Zyrtec at night to alleviate symptoms. - Recommend taking Zyrtec at night - Order liver function tests  Osteoporosis Tolerating Prolia, second injection given today.  Will continue for 2 years and then recheck.  Check calcium and vitamin D levels today.  Continue oral supplements and exercise.  General Health Maintenance Due for routine health maintenance screenings. - Schedule mammogram - Follow up with eye doctor in February -Flu shot updated today  Follow-up - Follow up in three months to assess response to Wellbutrin - Monitor lab results and communicate any concerns.     I spent a total of 52 minutes for this patient encounter. Time spent included preparation, face-to-face counseling with the patient and coordination of care, review of chart and records, and documentation of the encounter.  Education regarding management of these chronic disease states was given. Management strategies discussed on successive visits include dietary and exercise recommendations, goals of achieving and maintaining IBW, and lifestyle modifications aiming for adequate sleep and minimizing stressors.   Follow up: 3 months to recheck mood  Orders Placed This Encounter  Procedures   MM DIGITAL SCREENING BILATERAL   Flu Vaccine Trivalent  High Dose (Fluad)   VITAMIN D 25 Hydroxy (Vit-D Deficiency, Fractures)   CBC with Differential/Platelet   Comprehensive metabolic panel   TSH   Meds ordered this encounter  Medications   denosumab (PROLIA) injection 60 mg    Order Specific Question:   Patient is enrolled in [facility] program for this medication and I have provided a copy of the Prolia Medication Guide and Patient Brochure.    Answer:   No    Order Specific Question:   I have reviewed with the patient the information in the Prolia Medication Guide and Patient Counseling Chart including the serious risks of Prolia and symptoms of each risk.    Answer:   Yes    Order Specific Question:   I have advised the patient to seek medical attention if they have signs or symptoms of any of the serious risks.    Answer:   Yes   buPROPion (WELLBUTRIN XL) 150 MG 24 hr tablet    Sig: Take 1 tablet (150 mg total) by mouth daily.    Dispense:  90 tablet    Refill:  3      BP Readings from Last 3 Encounters:  03/12/23 120/68  10/19/22 128/71  07/19/22 120/70   Wt Readings from Last 3 Encounters:  03/12/23 130 lb 3.2 oz (59.1 kg)  09/22/22 125 lb 12.8 oz (57.1 kg)  07/18/22 129 lb 9.6 oz (58.8 kg)    Lab Results  Component Value Date   CHOL 234 (H) 01/02/2021   CHOL 256 (H) 09/26/2019   Lab Results  Component Value Date   HDL 88.50 01/02/2021   HDL 87.20  09/26/2019   Lab Results  Component Value Date   LDLCALC 134 (H) 01/02/2021   LDLCALC 154 (H) 09/26/2019   Lab Results  Component Value Date   TRIG 59.0 01/02/2021   TRIG 76.0 09/26/2019   Lab Results  Component Value Date   CHOLHDL 3 01/02/2021   CHOLHDL 3 09/26/2019   No results found for: "LDLDIRECT" Lab Results  Component Value Date   CREATININE 0.64 01/21/2022   BUN 13 01/21/2022   NA 136 01/21/2022   K 4.3 01/21/2022   CL 100 01/21/2022   CO2 28 01/21/2022    The 10-year ASCVD risk score (Arnett DK, et al., 2019) is: 12%   Values used to calculate  the score:     Age: 60 years     Sex: Female     Is Non-Hispanic African American: No     Diabetic: No     Tobacco smoker: No     Systolic Blood Pressure: 120 mmHg     Is BP treated: Yes     HDL Cholesterol: 88.5 mg/dL     Total Cholesterol: 234 mg/dL  I reviewed the patients updated PMH, FH, and SocHx.    Patient Active Problem List   Diagnosis Date Noted   Age related osteoporosis 11/28/2019    Priority: High   Refusal of statin medication by patient 09/26/2019    Priority: High   Acquired hypothyroidism 12/06/2015    Priority: High   White coat syndrome with diagnosis of hypertension 12/06/2015    Priority: High   Mixed hyperlipidemia 12/06/2015    Priority: High   Adjustment reaction with anxiety 03/12/2023    Priority: Medium    Closed fracture of left distal radius 07/30/2021    Priority: Medium    History of fusion of cervical spine 12/06/2015    Priority: Medium     Allergies: Penicillins  Social History: Patient  reports that she has never smoked. She has never used smokeless tobacco. She reports current alcohol use. She reports that she does not use drugs.  Current Meds  Medication Sig   Ascorbic Acid (VITAMIN C) 1000 MG tablet Take 1,000 mg by mouth daily. With a meal (lunch or supper)   B Complex-C (B-COMPLEX WITH VITAMIN C) tablet Take 1 tablet by mouth daily. With a meal (lunch or supper)   buPROPion (WELLBUTRIN XL) 150 MG 24 hr tablet Take 1 tablet (150 mg total) by mouth daily.   Cholecalciferol (VITAMIN D-3) 125 MCG (5000 UT) TABS Take 5,000 Units by mouth daily. With a meal (lunch or supper)   Coenzyme Q-10 100 MG capsule Take 100 mg by mouth daily. With a meal (lunch or supper)   [employer] TRIPLE MAGNESIUM COMPLEX PO Take 2 capsules by mouth daily.   levothyroxine (SYNTHROID) 100 MCG tablet TAKE 1 TABLET EVERY MORNINGBEFORE BREAKFAST   lisinopril (ZESTRIL) 5 MG tablet TAKE 1 TABLET DAILY   Menaquinone-7 (VITAMIN K2) 100 MCG CAPS Take 100 mcg by mouth  daily. With a meal (lunch or supper)   Milk Thistle 1000 MG CAPS Take 1,000 mg by mouth daily. With a meal (lunch or supper)   Omega-3 Fatty Acids (FISH OIL) 1000 MG CAPS Take 2,000 mg by mouth daily. With a meal (lunch or supper)    Review of Systems: Cardiovascular: negative for chest pain, palpitations, leg swelling, orthopnea Respiratory: negative for SOB, wheezing or persistent cough Gastrointestinal: negative for abdominal pain Genitourinary: negative for dysuria or gross hematuria  Objective  Vitals: BP 120/68  Comment: by consistent home readings/cla  Pulse 73   Temp 98 F (36.7 C)   Ht 5\' 4"  (1.626 m)   Wt 130 lb 3.2 oz (59.1 kg)   SpO2 99%   BMI 22.35 kg/m  General: no acute distress  Psych:  Alert and oriented, anxious mood and affect HEENT:  Normocephalic, atraumatic, supple neck  Cardiovascular:  RRR without murmur. no edema Respiratory:  Good breath sounds bilaterally, CTAB with normal respiratory effort Skin:  Warm, no rashes, scalp is clear, frontal hair thinning present, back is full Neurologic:   Mental status is normal Commons side effects, risks, benefits, and alternatives for medications and treatment plan prescribed today were discussed, and the patient expressed understanding of the given instructions. Patient is instructed to call or message via MyChart if he/she has any questions or concerns regarding our treatment plan. No barriers to understanding were identified. We discussed Red Flag symptoms and signs in detail. Patient expressed understanding regarding what to do in case of urgent or emergency type symptoms.  Medication list was reconciled, printed and provided to the patient in AVS. Patient instructions and summary information was reviewed with the patient as documented in the AVS. This note was prepared with assistance of Dragon voice recognition software. Occasional wrong-word or sound-a-like substitutions may have occurred due to the inherent  limitation

## 2023-03-12 NOTE — Patient Instructions (Addendum)
Please return in 3 months to recheck your mood and well being  I will release your lab results to you on your MyChart account with further instructions. You may see the results before I do, but when I review them I will send you a message with my report or have my assistant call you if things need to be discussed. Please reply to my message with any questions. Thank you!   Please call the office checked below to schedule your appointment for your mammogram and/or bone density screen (the checked studies were ordered): [x]   Mammogram  []   Bone Density  [x]   [facility]     [address] [place], [place]        [phone]         []   [facility]  [address] [place], [place]  [phone]  If you have any questions or concerns, please don't hesitate to send me a message via MyChart or call the office at [phone]. Thank you for visiting with Mercedes Briggs today! It's our pleasure caring for you.   VISIT SUMMARY:  During today's visit, we discussed your concerns about various symptoms you have been experiencing, including weight loss, hair loss, anxiety, jitteriness, insomnia, double vision, and a recent urinary tract infection. We reviewed your current medications and health conditions, including hypertension and hypothyroidism, and developed a plan to address each of your concerns.  YOUR PLAN:  -THYROID DYSFUNCTION: Thyroid dysfunction can cause symptoms like hair loss, anxiety, jitteriness, sleep disturbances, and itchy eyes. We will recheck your thyroid levels to ensure they are well-managed. If your thyroid levels are normal, we will explore other potential causes for your symptoms.  -GENERALIZED ANXIETY DISORDER: Generalized Anxiety Disorder involves excessive worry and anxiety that can affect daily life. We discussed restarting Wellbutrin at the lowest dose to help manage your anxiety and improve your overall well-being. We will follow up in  three months to assess your response to the medication.  -HYPERTENSION: Hypertension, or high blood pressure, can be influenced by stress and anxiety. You should continue your current blood pressure medication and monitor your blood pressure regularly.  -TELOGEN EFFLUVIUM: Telogen Effluvium is a condition where stress causes hair loss. This type of hair loss can take up to a year to normalize. We will monitor your hair loss and reassess if the symptoms persist or worsen.  -PRURITUS: Pruritus is generalized itching without a rash, which can be related to stress or allergies. We recommend taking Zyrtec at night to alleviate the itching. We will also order liver function tests to rule out other potential causes.  -GENERAL HEALTH MAINTENANCE: You are due for routine health maintenance screenings. Please schedule a mammogram and follow up with your eye doctor in February.  INSTRUCTIONS:  Please follow up in three months to assess your response to Wellbutrin. Monitor your blood pressure regularly and communicate any concerns. We will also review your thyroid function tests and liver function tests once the results are available.

## 2023-03-17 NOTE — Progress Notes (Signed)
See mychart note Dear Ms. Tonie Griffith, Your labs all look good! Hope you do well; we will see you in follow up.  Sincerely, Dr. Mardelle Matte

## 2023-05-05 ENCOUNTER — Other Ambulatory Visit: Payer: Self-pay | Admitting: Family Medicine

## 2023-05-05 DIAGNOSIS — Z1231 Encounter for screening mammogram for malignant neoplasm of breast: Secondary | ICD-10-CM

## 2023-05-21 ENCOUNTER — Ambulatory Visit: Payer: Medicare HMO | Admitting: Family Medicine

## 2023-05-21 ENCOUNTER — Encounter: Payer: Self-pay | Admitting: Family Medicine

## 2023-05-21 VITALS — BP 156/78 | HR 77 | Temp 98.1°F | Ht 64.0 in | Wt 132.2 lb

## 2023-05-21 DIAGNOSIS — F4322 Adjustment disorder with anxiety: Secondary | ICD-10-CM

## 2023-05-21 DIAGNOSIS — L658 Other specified nonscarring hair loss: Secondary | ICD-10-CM | POA: Diagnosis not present

## 2023-05-21 DIAGNOSIS — I10 Essential (primary) hypertension: Secondary | ICD-10-CM | POA: Diagnosis not present

## 2023-05-21 NOTE — Progress Notes (Signed)
Subjective  CC:  Chief Complaint  Patient presents with   Anxiety    HPI: Mercedes Briggs is a 73 y.o. female who presents to the office today to address the problems listed above in the chief complaint. Discussed the use of AI scribe software for clinical note transcription with the patient, who gave verbal consent to proceed.  History of Present Illness   Mercedes Briggs is a 73 year old female who presents for follow-up after starting Wellbutrin XL.  She has been taking Wellbutrin XL 150 mg daily for ten weeks to address anxiety and stress. She previously used Wellbutrin during menopause about thirty years ago for six months, finding it effective at that time. Currently, she is uncertain about its effectiveness and wants to discontinue it. Before starting the medication, she experienced increased tearfulness and low-grade sadness without a clear reason, which she feels has improved since starting the medication. See last note. Her anxiety screen is improved today. See below  She reports experiencing hair loss, which she attributes to the medication, describing it as 'shedding worse than my dog' and noting significant hair thinning. No rashes have been experienced.   She has a history of white coat hypertension but reports normal blood pressure readings at home. She experiences nervousness in medical settings, including visits to the dentist.  Her social history includes staying active with gym activities, dog walking, quilting, reading, and spending time with her family. She describes herself as a 'border collie' type, always busy and active. She has a supportive family, including a daughter who is a Engineer, civil (consulting), and she communicates with her frequently.       Assessment  1. Adjustment reaction with anxiety   2. 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 Patient reports less tearfulness and improved mood. However, patient is considering discontinuation due to perceived hair loss, a rare side effect of Wellbutrin. Discussed the benefits of continued treatment and the risk of relapse if medication is discontinued prematurely. -Continue Wellbutrin XL 150mg  daily. -Consider use of topical Minoxidil for hair thinning. -Reevaluate in 2-3 months and consider weaning at that time.  White Coat Hypertension Patient reports normal home blood pressure readings. No changes in management needed. -Continue current management.lisinopril 5  General Health Maintenance -mammogram in 2 weeks. -Annual follow-up in December.        No orders of the defined types were placed in this encounter.  No orders of the defined types were placed in this encounter.    I reviewed the patients updated PMH, FH, and SocHx.    Patient Active Problem List   Diagnosis Date Noted   Age related osteoporosis 11/28/2019    Priority: High   Refusal of statin medication by patient 09/26/2019    Priority: High   Acquired hypothyroidism 12/06/2015    Priority: High   White coat syndrome with diagnosis of hypertension 12/06/2015    Priority: High   Mixed hyperlipidemia 12/06/2015    Priority: High   Adjustment reaction with anxiety 03/12/2023    Priority: Medium    Closed fracture of left distal radius 07/30/2021    Priority: Medium    History of fusion of cervical spine 12/06/2015    Priority: Medium    Current Meds  Medication Sig   Ascorbic Acid (VITAMIN C) 1000 MG tablet  Take 1,000 mg by mouth daily. With a meal (lunch or supper)   B Complex-C (B-COMPLEX WITH VITAMIN C) tablet Take 1 tablet by mouth daily. With a meal (lunch or supper)   buPROPion (WELLBUTRIN XL) 150 MG 24 hr tablet Take 1 tablet (150 mg total) by mouth daily.   Cholecalciferol (VITAMIN D-3) 125 MCG (5000 UT) TABS Take 5,000 Units by mouth daily.  With a meal (lunch or supper)   Coenzyme Q-10 100 MG capsule Take 100 mg by mouth daily. With a meal (lunch or supper)   [employer]S TRIPLE MAGNESIUM COMPLEX PO Take 2 capsules by mouth daily.   levothyroxine (SYNTHROID) 100 MCG tablet TAKE 1 TABLET EVERY MORNINGBEFORE BREAKFAST   lisinopril (ZESTRIL) 5 MG tablet TAKE 1 TABLET DAILY   Menaquinone-7 (VITAMIN K2) 100 MCG CAPS Take 100 mcg by mouth daily. With a meal (lunch or supper)   Milk Thistle 1000 MG CAPS Take 1,000 mg by mouth daily. With a meal (lunch or supper)   Omega-3 Fatty Acids (FISH OIL) 1000 MG CAPS Take 2,000 mg by mouth daily. With a meal (lunch or supper)    Allergies: Patient is allergic to penicillins. Family History: Patient family history includes Alzheimer's disease in her mother; Arthritis in her mother; Asthma in her father; Atrial fibrillation in her mother; COPD in her father; Dementia in her mother; Diabetes in her brother; Heart disease in her father; Hypertension in her father and mother; Neurodegenerative disease in her brother. Social History:  Patient  reports that she has never smoked. She has never used smokeless tobacco. She reports current alcohol use. She reports that she does not use drugs.  Review of Systems: Constitutional: Negative for fever malaise or anorexia Cardiovascular: negative for chest pain Respiratory: negative for SOB or persistent cough Gastrointestinal: negative for abdominal pain  Objective  Vitals: BP (!) 156/78   Pulse 77   Temp 98.1 F (36.7 C)   Ht 5\' 4"  (1.626 m)   Wt 132 lb 3.2 oz (60 kg)   SpO2 99%   BMI 22.69 kg/m  General: no acute distress , A&Ox3    05/21/2023    9:01 AM 03/12/2023    8:09 AM 09/22/2022    8:08 AM 07/18/2022   11:23 AM 01/21/2022    8:29 AM  Depression screen PHQ 2/9  Decreased Interest 0 0 0 0 0  Down, Depressed, Hopeless 0 1 0 0 0  PHQ - 2 Score 0 1 0 0 0      05/21/2023    9:01 AM 03/12/2023    8:10 AM 07/18/2022   11:23 AM  GAD 7 : Generalized  Anxiety Score  Nervous, Anxious, on Edge 0 2 0  Control/stop worrying 1 1 0  Worry too much - different things 1 1 0  Trouble relaxing 0 1 0  Restless 0 2 0  Easily annoyed or irritable 1 1 0  Afraid - awful might happen 2 2 0  Total GAD 7 Score 5 10 0  Anxiety Difficulty Not difficult at all Not difficult at all Not difficult at all      Commons side effects, risks, benefits, and alternatives for medications and treatment plan prescribed today were discussed, and the patient expressed understanding of the given instructions. Patient is instructed to call or message via MyChart if he/she has any questions or concerns regarding our treatment plan. No barriers to understanding were identified. We discussed Red Flag symptoms and signs in detail. Patient expressed understanding regarding what  to do in case of urgent or emergency type symptoms.  Medication list was reconciled, printed and provided to the patient in AVS. Patient instructions and summary information was reviewed with the patient as documented in the AVS. This note was prepared with assistance of Dragon voice recognition software. Occasional wrong-word or sound-a-like substitutions may have occurred due to the inherent limitations of voice recognition software

## 2023-05-21 NOTE — Patient Instructions (Signed)
Please return in December 2025 for your annual complete physical; please come fasting.   If you have any questions or concerns, please don't hesitate to send me a message via MyChart or call the office at [phone]. Thank you for visiting with [place] today! It's our pleasure caring for you.   VISIT SUMMARY:  Mercedes Briggs, a 73 year old female, came in for a follow-up visit after starting Wellbutrin XL to address anxiety and stress. She has been taking the medication for ten weeks and has noticed some improvement in her mood but is concerned about hair loss, which she attributes to the medication. She also has a history of white coat hypertension but reports normal blood pressure readings at home. Her social history includes staying active with various activities and having a supportive family.  YOUR PLAN:  -ANXIETY AND STRESS REACTION: Anxiety and stress reaction refers to feelings of worry and tension that can affect daily life. You have noticed some improvement in your mood since starting Wellbutrin XL 150mg  daily, but you are concerned about hair loss. We discussed the benefits of continuing the medication and the risk of relapse if it is discontinued prematurely. You should continue taking Wellbutrin XL 150mg  daily and consider using topical Minoxidil for hair thinning. We will reevaluate your condition in 2-3 months.  -WHITE COAT HYPERTENSION: White coat hypertension is when blood pressure readings are higher in a medical setting but normal at home. You report normal blood pressure readings at home, so no changes in management are needed. Continue with your current management.  -GENERAL HEALTH MAINTENANCE: For your general health, you have a scheduled mammogram in 2 weeks and an annual follow-up appointment in December. Keep up with your routine health checks and maintain a healthy lifestyle.  INSTRUCTIONS:  Please continue taking Wellbutrin XL 150mg  daily and consider using topical  Minoxidil for hair thinning. We will reevaluate your condition in 2-3 months. Your mammogram is scheduled in 2 weeks, and your annual follow-up is in December.

## 2023-06-04 ENCOUNTER — Ambulatory Visit
Admission: RE | Admit: 2023-06-04 | Discharge: 2023-06-04 | Disposition: A | Discharge status: Home / Self Care | Payer: Medicare HMO | Source: Ambulatory Visit

## 2023-06-04 DIAGNOSIS — Z1231 Encounter for screening mammogram for malignant neoplasm of breast: Secondary | ICD-10-CM

## 2023-06-04 DIAGNOSIS — H16223 Keratoconjunctivitis sicca, not specified as Sjogren's, bilateral: Secondary | ICD-10-CM | POA: Diagnosis not present

## 2023-06-04 DIAGNOSIS — H532 Diplopia: Secondary | ICD-10-CM | POA: Diagnosis not present

## 2023-06-04 DIAGNOSIS — H2513 Age-related nuclear cataract, bilateral: Secondary | ICD-10-CM | POA: Diagnosis not present

## 2023-06-08 ENCOUNTER — Ambulatory Visit: Payer: Medicare Other | Admitting: Family Medicine

## 2023-06-22 ENCOUNTER — Encounter: Payer: Self-pay | Admitting: Family Medicine

## 2023-06-22 ENCOUNTER — Other Ambulatory Visit: Payer: Self-pay

## 2023-06-22 MED ORDER — LISINOPRIL 5 MG PO TABS: 5.0000 mg | ORAL_TABLET | Freq: Every day | ORAL | 3 refills | Status: AC

## 2023-06-22 MED ORDER — LEVOTHYROXINE SODIUM 100 MCG PO TABS: ORAL_TABLET | ORAL | 1 refills | Status: DC

## 2023-09-02 ENCOUNTER — Encounter: Payer: Self-pay | Admitting: Family

## 2023-09-02 ENCOUNTER — Ambulatory Visit (INDEPENDENT_AMBULATORY_CARE_PROVIDER_SITE_OTHER): Admitting: Family

## 2023-09-02 VITALS — BP 149/77 | HR 73 | Temp 97.9°F | Ht 64.0 in | Wt 129.2 lb

## 2023-09-02 DIAGNOSIS — N3090 Cystitis, unspecified without hematuria: Secondary | ICD-10-CM | POA: Diagnosis not present

## 2023-09-02 LAB — POCT URINALYSIS DIPSTICK
Bilirubin, UA: NEGATIVE
Blood, UA: NEGATIVE
Glucose, UA: NEGATIVE
Ketones, UA: POSITIVE
Leukocytes, UA: NEGATIVE
Nitrite, UA: NEGATIVE
Protein, UA: NEGATIVE
Spec Grav, UA: 1.015 (ref 1.010–1.025)
Urobilinogen, UA: 0.2 U/dL
pH, UA: 6 (ref 5.0–8.0)

## 2023-09-02 MED ORDER — SULFAMETHOXAZOLE-TRIMETHOPRIM 800-160 MG PO TABS: 1.0000 | ORAL_TABLET | Freq: Two times a day (BID) | ORAL | 0 refills | Status: DC

## 2023-09-02 NOTE — Progress Notes (Signed)
 Patient ID: Mercedes Briggs, female    DOB: [birth year], 73 y.o.   MRN: [id number]  Chief Complaint  Patient presents with  . Urinary Frequency    Pt c/o urinary frequency, dysuria, lower back pain and urgency. Present for 4 days. Has tried UT- Vibrance which did help slightly.   Discussed the use of AI scribe software for clinical note transcription with the patient, who gave verbal consent to proceed.  History of Present Illness Mercedes Briggs is a 73 year old female who presents with bladder issues.  She associates her bladder issues with Prolia  injections, having experienced a similar episode previously. She denies any recent diarrhea or other antibiotics taken recently. She used over-the-counter home UTI test strips, which showed a 'really purple' result yesterday. She feels slightly better today with no burning sensation but still experiences pressure. She has been taking an OTC UT-Vibrance supplement that contains D-mannose three to four times daily for two days. Her urine was cloudy over the weekend, and she experiences spasms during and after urination. She acknowledges possibly inadequate water intake and considers increasing her fluid consumption.  Assessment & Plan Urinary tract infection - Symptoms suggest UTI. Reports positive home strip yesterday, today mildly positive, office UA is negative. Will treat as OTC medication can be masking infection. Bactrim  preferred due to shorter course and previous tolerance, considering penicillin allergy.  - Prescribe Bactrim  twice daily for a few days, advised on use & SE. - Discontinue OTC supplement. - Encourage increased fluid intake for light yellow to clear urine. - Increase fluid intake to 2L qd. - Contact office if no improvement after third day of antibiotics.   Subjective:     Outpatient Medications Prior to Visit  Medication Sig Dispense Refill  . Ascorbic Acid (VITAMIN C) 1000 MG tablet Take 1,000 mg by mouth  daily. With a meal (lunch or supper)    . B Complex-C (B-COMPLEX WITH VITAMIN C) tablet Take 1 tablet by mouth daily. With a meal (lunch or supper)    . Cholecalciferol (VITAMIN D -3) 125 MCG (5000 UT) TABS Take 5,000 Units by mouth daily. With a meal (lunch or supper)    . Coenzyme Q-10 100 MG capsule Take 100 mg by mouth daily. With a meal (lunch or supper)    . levothyroxine  (SYNTHROID ) 100 MCG tablet TAKE 1 TABLET EVERY MORNINGBEFORE BREAKFAST 90 tablet 1  . lisinopril  (ZESTRIL ) 5 MG tablet Take 1 tablet (5 mg total) by mouth daily. 90 tablet 3  . MAGNESIUM GLYCINATE PO Take by mouth.    . Menaquinone-7 (VITAMIN K2) 100 MCG CAPS Take 100 mcg by mouth daily. With a meal (lunch or supper)    . Milk Thistle 1000 MG CAPS Take 1,000 mg by mouth daily. With a meal (lunch or supper)    . Omega-3 Fatty Acids (FISH OIL) 1000 MG CAPS Take 2,000 mg by mouth daily. With a meal (lunch or supper)    . buPROPion  (WELLBUTRIN  XL) 150 MG 24 hr tablet Take 1 tablet (150 mg total) by mouth daily. 90 tablet 3  . CVS TRIPLE MAGNESIUM COMPLEX PO Take 2 capsules by mouth daily.     No facility-administered medications prior to visit.   Past Medical History:  Diagnosis Date  . Acquired trigger finger of left middle finger 03/07/2022  . Acquired trigger finger of left ring finger 03/07/2022  . 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 Offer treatment.   . Anxiety   .  Herniated disc, cervical   . Hyperlipidemia   . Hypertension   . Hypothyroidism   . Left wrist pain 03/06/2022  . Unilateral primary osteoarthritis of first carpometacarpal joint, left hand 03/07/2022   Past Surgical History:  Procedure Laterality Date  . ABDOMINAL HYSTERECTOMY    . ANTERIOR CERVICAL DISCECTOMY    . CESAREAN SECTION    . OPEN REDUCTION INTERNAL FIXATION (ORIF) DISTAL RADIAL FRACTURE Left 08/05/2021   Procedure: LEFT OPEN REDUCTION INTERNAL FIXATION (ORIF) DISTAL RADIAL FRACTURE;  Surgeon:  Marilyn Shropshire, MD;  Location: [facility] OR;  Service: Orthopedics;  Laterality: Left;  . TONSILLECTOMY     Allergies  Allergen Reactions  . Penicillins Hives      Objective:    Physical Exam Vitals and nursing note reviewed.  Constitutional:      Appearance: Normal appearance.  Cardiovascular:     Rate and Rhythm: Normal rate and regular rhythm.  Pulmonary:     Effort: Pulmonary effort is normal.     Breath sounds: Normal breath sounds.  Musculoskeletal:        General: Normal range of motion.  Skin:    General: Skin is warm and dry.  Neurological:     Mental Status: She is alert.  Psychiatric:        Mood and Affect: Mood normal.        Behavior: Behavior normal.   BP (!) 149/77 (BP Location: Left Arm, Patient Position: Sitting, Cuff Size: Large)   Pulse 73   Temp 97.9 F (36.6 C) (Temporal)   Ht 5\' 4"  (1.626 m)   Wt 129 lb 3.2 oz (58.6 kg)   SpO2 100%   BMI 22.18 kg/m  Wt Readings from Last 3 Encounters:  09/02/23 129 lb 3.2 oz (58.6 kg)  05/21/23 132 lb 3.2 oz (60 kg)  03/12/23 130 lb 3.2 oz (59.1 kg)      Versa Gore, NP

## 2023-09-28 ENCOUNTER — Ambulatory Visit (INDEPENDENT_AMBULATORY_CARE_PROVIDER_SITE_OTHER): Payer: Medicare Other

## 2023-09-28 VITALS — Ht 64.0 in | Wt 129.0 lb

## 2023-09-28 DIAGNOSIS — Z0000 Encounter for general adult medical examination without abnormal findings: Secondary | ICD-10-CM

## 2023-09-28 NOTE — Progress Notes (Signed)
 Subjective:   Mercedes Briggs is a 73 y.o. who presents for a Medicare Wellness preventive visit.  As a reminder, Annual Wellness Visits don't include a physical exam, and some assessments may be limited, especially if this visit is performed virtually. We may recommend an in-person follow-up visit with your provider if needed.  Visit Complete: Virtual I connected with  Mercedes Briggs on 09/28/23 by a audio enabled telemedicine application and verified that I am speaking with the correct person using two identifiers.  Patient Location: Home  Provider Location: Office/Clinic  I discussed the limitations of evaluation and management by telemedicine. The patient expressed understanding and agreed to proceed.  Vital Signs: Because this visit was a virtual/telehealth visit, some criteria may be missing or patient reported. Any vitals not documented were not able to be obtained and vitals that have been documented are patient reported.  VideoDeclined- This patient declined Librarian, academic. Therefore the visit was completed with audio only.  Persons Participating in Visit: Patient.  AWV Questionnaire: No: Patient Medicare AWV questionnaire was not completed prior to this visit.  Cardiac Risk Factors include: advanced age (>63men, >23 women);dyslipidemia;hypertension     Objective:    Today's Vitals   09/28/23 0811  Weight: 129 lb (58.5 kg)  Height: 5' 4 (1.626 m)   Body mass index is 22.14 kg/m.     09/28/2023    8:15 AM 09/22/2022    8:10 AM 09/16/2021    8:10 AM 08/21/2021    1:17 PM 08/05/2021   12:18 PM 07/29/2021   11:44 AM 07/21/2019    8:42 AM  Advanced Directives  Does Patient Have a Medical Advance Directive? Yes Yes Yes Yes Yes Yes Yes  Type of Estate agent of [place];Living will Healthcare Power of [place];Living will Healthcare Power of [employer] of [place];Living will Healthcare  Power of [place];Living will Living will;Healthcare Power of Attorney  Does patient want to make changes to medical advance directive? No - Patient declined No - Patient declined   No - Patient declined  No - Patient declined  Copy of Healthcare Power of Attorney in Chart? Yes - validated most recent copy scanned in chart (See row information) Yes - validated most recent copy scanned in chart (See row information) Yes - validated most recent copy scanned in chart (See row information) No - copy requested No - copy requested  No - copy requested    Current Medications (verified) Outpatient Encounter Medications as of 09/28/2023  Medication Sig   Ascorbic Acid (VITAMIN C) 1000 MG tablet Take 1,000 mg by mouth daily. With a meal (lunch or supper)   B Complex-C (B-COMPLEX WITH VITAMIN C) tablet Take 1 tablet by mouth daily. With a meal (lunch or supper)   Cholecalciferol (VITAMIN D -3) 125 MCG (5000 UT) TABS Take 5,000 Units by mouth daily. With a meal (lunch or supper)   Coenzyme Q-10 100 MG capsule Take 100 mg by mouth daily. With a meal (lunch or supper)   levothyroxine  (SYNTHROID ) 100 MCG tablet TAKE 1 TABLET EVERY MORNINGBEFORE BREAKFAST   lisinopril  (ZESTRIL ) 5 MG tablet Take 1 tablet (5 mg total) by mouth daily.   MAGNESIUM GLYCINATE PO Take by mouth.   Menaquinone-7 (VITAMIN K2) 100 MCG CAPS Take 100 mcg by mouth daily. With a meal (lunch or supper)   Milk Thistle 1000 MG CAPS Take 1,000 mg by mouth daily. With a meal (lunch or supper)   Omega-3 Fatty Acids (FISH OIL)  1000 MG CAPS Take 2,000 mg by mouth daily. With a meal (lunch or supper)   [DISCONTINUED] sulfamethoxazole -trimethoprim  (BACTRIM  DS) 800-160 MG tablet Take 1 tablet by mouth 2 (two) times daily after a meal.   No facility-administered encounter medications on file as of 09/28/2023.    Allergies (verified) Penicillins   History: Past Medical History:  Diagnosis Date   Acquired trigger finger of left middle finger  03/07/2022   Acquired trigger finger of left ring finger 03/07/2022   Age related osteoporosis 11/28/2019   Dexa 11/2019: T = - 2.5 lumbar spine. Offer treatment.    Anxiety    Herniated disc, cervical    Hyperlipidemia    Hypertension    Hypothyroidism    Left wrist pain 03/06/2022   Unilateral primary osteoarthritis of first carpometacarpal joint, left hand 03/07/2022   Past Surgical History:  Procedure Laterality Date   ABDOMINAL HYSTERECTOMY     ANTERIOR CERVICAL DISCECTOMY     CESAREAN SECTION     OPEN REDUCTION INTERNAL FIXATION (ORIF) DISTAL RADIAL FRACTURE Left 08/05/2021   Procedure: LEFT OPEN REDUCTION INTERNAL FIXATION (ORIF) DISTAL RADIAL FRACTURE;  Surgeon: Romona Harari, MD;  Location: [facility] OR;  Service: Orthopedics;  Laterality: Left;   TONSILLECTOMY     Family History  Problem Relation Age of Onset   Alzheimer's disease Mother    Arthritis Mother    Atrial fibrillation Mother    Dementia Mother    Hypertension Mother    Asthma Father    COPD Father    Heart disease Father    Hypertension Father    Diabetes Brother    Neurodegenerative disease Brother        storage cell disease   Social History   Socioeconomic History   Marital status: Married    Spouse name: Not on file   Number of children: Not on file   Years of education: Not on file   Highest education level: Master's degree (e.g., MA, MS, MEng, MEd, MSW, MBA)  Occupational History   Occupation: Retired     Comment: Doctor, general practice   Tobacco Use   Smoking status: Never   Smokeless tobacco: Never   Tobacco comments:    occasionally   Vaping Use   Vaping status: Never Used  Substance and Sexual Activity   Alcohol use: Yes    Comment: socially   Drug use: Never   Sexual activity: Yes    Birth control/protection: Post-menopausal  Other Topics Concern   Not on file  Social History Narrative   1 daughter that lives in [place]     Social Drivers of Health   Financial Resource Strain:  Low Risk  (09/28/2023)   Overall Financial Resource Strain (CARDIA)    Difficulty of Paying Living Expenses: Not hard at all  Food Insecurity: No Food Insecurity (09/28/2023)   Hunger Vital Sign    Worried About Running Out of Food in the Last Year: Never true    Ran Out of Food in the Last Year: Never true  Transportation Needs: No Transportation Needs (09/28/2023)   PRAPARE - Administrator, Civil Service (Medical): No    Lack of Transportation (Non-Medical): No  Physical Activity: Sufficiently Active (09/28/2023)   Exercise Vital Sign    Days of Exercise per Week: 6 days    Minutes of Exercise per Session: 40 min  Stress: No Stress Concern Present (09/28/2023)   [employer] of Occupational Health - Occupational Stress Questionnaire    Feeling of Stress:  Not at all  Social Connections: Socially Integrated (09/28/2023)   Social Connection and Isolation Panel    Frequency of Communication with Friends and Family: More than three times a week    Frequency of Social Gatherings with Friends and Family: More than three times a week    Attends Religious Services: 1 to 4 times per year    Active Member of [employer] or Organizations: Yes    Attends Banker Meetings: 1 to 4 times per year    Marital Status: Married    Tobacco Counseling Counseling given: Not Answered Tobacco comments: occasionally     Clinical Intake:  Pre-visit preparation completed: Yes  Pain : No/denies pain     BMI - recorded: 22.14 Nutritional Status: BMI of 19-24  Normal Diabetes: No  No results found for: HGBA1C   How often do you need to have someone help you when you read instructions, pamphlets, or other written materials from your doctor or pharmacy?: 1 - Never  Interpreter Needed?: No  Information entered by :: Ellouise Haws, LPN   Activities of Daily Living     09/28/2023    8:16 AM  In your present state of health, do you have any difficulty performing the  following activities:  Hearing? 0  Vision? 0  Difficulty concentrating or making decisions? 0  Walking or climbing stairs? 0  Dressing or bathing? 0  Doing errands, shopping? 0  Preparing Food and eating ? N  Using the Toilet? N  In the past six months, have you accidently leaked urine? Y  Comment wears a liner for just in case  Do you have problems with loss of bowel control? N  Managing your Medications? N  Managing your Finances? N  Housekeeping or managing your Housekeeping? N    Patient Care Team: Jodie Lavern CROME, MD as PCP - General (Family Medicine) Zara Burnard PARAS, OD (Inactive) as Consulting Physician (Optometry)  I have updated your Care Teams any recent Medical Services you may have received from other providers in the past year.     Assessment:   This is a routine wellness examination for Mercedes Briggs.  Hearing/Vision screen Hearing Screening - Comments:: Pt denies any hearing issues  Vision Screening - Comments:: Wears rx glasses - up to date with routine eye exams with [facility] Dr Geroge     Goals Addressed             This Visit's Progress    Patient Stated       Maintain health and activity        Depression Screen     09/28/2023    8:14 AM 05/21/2023    9:01 AM 03/12/2023    8:09 AM 09/22/2022    8:08 AM 07/18/2022   11:23 AM 01/21/2022    8:29 AM 09/16/2021    8:09 AM  PHQ 2/9 Scores  PHQ - 2 Score 0 0 1 0 0 0 0    Fall Risk     09/28/2023    8:16 AM 05/21/2023    8:58 AM 03/12/2023    8:09 AM 09/21/2022    6:36 AM 07/18/2022   11:23 AM  Fall Risk   Falls in the past year? 0 0 1 1 0  Number falls in past yr: 0 0 0 0 0  Injury with Fall? 0 0 0 0 0  Risk for fall due to : No Fall Risks No Fall Risks No Fall Risks Impaired vision;Impaired balance/gait  No Fall Risks  Follow up Falls prevention discussed Falls evaluation completed Falls evaluation completed Falls prevention discussed Falls evaluation completed    MEDICARE RISK AT HOME:   Medicare Risk at Home Any stairs in or around the home?: Yes If so, are there any without handrails?: No Home free of loose throw rugs in walkways, pet beds, electrical cords, etc?: Yes Adequate lighting in your home to reduce risk of falls?: Yes Life alert?: No Use of a cane, walker or w/c?: No Grab bars in the bathroom?: Yes Shower chair or bench in shower?: Yes Elevated toilet seat or a handicapped toilet?: Yes  TIMED UP AND GO:  Was the test performed?  No  Cognitive Function: 6CIT completed        09/28/2023    8:17 AM 09/22/2022    8:12 AM 08/27/2020    2:03 PM 07/21/2019    8:43 AM  6CIT Screen  What Year? 0 points 0 points 0 points 0 points  What month? 0 points 0 points 0 points 0 points  What time? 0 points 0 points  0 points  Count back from 20 0 points 0 points 0 points 0 points  Months in reverse 0 points 0 points 0 points 0 points  Repeat phrase 0 points 0 points 0 points 0 points  Total Score 0 points 0 points  0 points    Immunizations Immunization History  Administered Date(s) Administered   Fluad Quad(high Dose 65+) 01/21/2022   Fluad Trivalent(High Dose 65+) 03/12/2023   Influenza, High Dose Seasonal PF 04/19/2016   Moderna Sars-Covid-2 Vaccination 05/20/2019, 06/17/2019, 02/07/2020   Pneumococcal Conjugate-13 04/19/2016   Pneumococcal Polysaccharide-23 12/14/2017   Zoster Recombinant(Shingrix ) 02/03/2022, 04/11/2022   Zoster, Live 12/06/2015    Screening Tests Health Maintenance  Topic Date Due   COVID-19 Vaccine (4 - 2024-25 season) 12/07/2022   INFLUENZA VACCINE  11/06/2023   MAMMOGRAM  06/03/2024   DEXA SCAN  07/13/2024   Medicare Annual Wellness (AWV)  09/27/2024   Fecal DNA (Cologuard)  08/19/2025   Pneumococcal Vaccine: 50+ Years  Completed   Hepatitis C Screening  Completed   Zoster Vaccines- Shingrix   Completed   HPV VACCINES  Aged Out   Meningococcal B Vaccine  Aged Out   DTaP/Tdap/Td  Discontinued    Health  Maintenance  Health Maintenance Due  Topic Date Due   COVID-19 Vaccine (4 - 2024-25 season) 12/07/2022   Health Maintenance Items Addressed: See Nurse Notes at the end of this note  Additional Screening:  Vision Screening: Recommended annual ophthalmology exams for early detection of glaucoma and other disorders of the eye. Would you like a referral to an eye doctor? No    Dental Screening: Recommended annual dental exams for proper oral hygiene  Community Resource Referral / Chronic Care Management: CRR required this visit?  No   CCM required this visit?  No   Plan:    I have personally reviewed and noted the following in the patient's chart:   Medical and social history Use of alcohol, tobacco or illicit drugs  Current medications and supplements including opioid prescriptions. Patient is not currently taking opioid prescriptions. Functional ability and status Nutritional status Physical activity Advanced directives List of other physicians Hospitalizations, surgeries, and ER visits in previous 12 months Vitals Screenings to include cognitive, depression, and falls Referrals and appointments  In addition, I have reviewed and discussed with patient certain preventive protocols, quality metrics, and best practice recommendations. A written personalized care plan for  preventive services as well as general preventive health recommendations were provided to patient.   Ellouise VEAR Haws, LPN   [id number]   After Visit Summary: (MyChart) Due to this being a telephonic visit, the after visit summary with patients personalized plan was offered to patient via MyChart   Notes: Nothing significant to report at this time.

## 2023-09-28 NOTE — Patient Instructions (Signed)
 Mercedes Briggs , Thank you for taking time out of your busy schedule to complete your Annual Wellness Visit with me. I enjoyed our conversation and look forward to speaking with you again next year. I, as well as your care team,  appreciate your ongoing commitment to your health goals. Please review the following plan we discussed and let me know if I can assist you in the future. Your Game plan/ To Do List    Referrals: If you haven't heard from the office you've been referred to, please reach out to them at the phone provided.   Follow up Visits: Next Medicare AWV with our clinical staff: 10/03/24   Have you seen your provider in the last 6 months (3 months if uncontrolled diabetes)? Yes Next Office Visit with your provider: no appt scheduled at this time   Clinician Recommendations:  Aim for 30 minutes of exercise or brisk walking, 6-8 glasses of water, and 5 servings of fruits and vegetables each day.       This is a list of the screening recommended for you and due dates:  Health Maintenance  Topic Date Due   COVID-19 Vaccine (4 - 2024-25 season) 12/07/2022   Flu Shot  11/06/2023   Mammogram  06/03/2024   DEXA scan (bone density measurement)  07/13/2024   Medicare Annual Wellness Visit  09/27/2024   Cologuard (Stool DNA test)  08/19/2025   Pneumococcal Vaccine for age over 58  Completed   Hepatitis C Screening  Completed   Zoster (Shingles) Vaccine  Completed   HPV Vaccine  Aged Out   Meningitis B Vaccine  Aged Out   DTaP/Tdap/Td vaccine  Discontinued    Advanced directives: (In Chart) A copy of your advanced directives are scanned into your chart should your provider ever need it. Advance Care Planning is important because it:  [x]  Makes sure you receive the medical care that is consistent with your values, goals, and preferences  [x]  It provides guidance to your family and loved ones and reduces their decisional burden about whether or not they are making the right  decisions based on your wishes.  Follow the link provided in your after visit summary or read over the paperwork we have mailed to you to help you started getting your Advance Directives in place. If you need assistance in completing these, please reach out to us  so that we can help you!  See attachments for Preventive Care and Fall Prevention Tips.

## 2023-10-26 DIAGNOSIS — T63481A Toxic effect of venom of other arthropod, accidental (unintentional), initial encounter: Secondary | ICD-10-CM | POA: Diagnosis not present

## 2023-10-26 DIAGNOSIS — T7840XA Allergy, unspecified, initial encounter: Secondary | ICD-10-CM | POA: Diagnosis not present

## 2023-12-23 ENCOUNTER — Other Ambulatory Visit: Payer: Self-pay | Admitting: Family Medicine

## 2024-01-11 DIAGNOSIS — N390 Urinary tract infection, site not specified: Secondary | ICD-10-CM | POA: Diagnosis not present

## 2024-01-11 DIAGNOSIS — R350 Frequency of micturition: Secondary | ICD-10-CM | POA: Diagnosis not present

## 2024-01-11 DIAGNOSIS — R300 Dysuria: Secondary | ICD-10-CM | POA: Diagnosis not present

## 2024-01-25 ENCOUNTER — Ambulatory Visit
Admission: RE | Admit: 2024-01-25 | Discharge: 2024-01-25 | Disposition: A | Discharge status: Home / Self Care | Source: Ambulatory Visit | Attending: Family Medicine | Admitting: Family Medicine

## 2024-01-25 VITALS — BP 152/83 | HR 66 | Temp 98.2°F | Resp 16

## 2024-01-25 DIAGNOSIS — N3000 Acute cystitis without hematuria: Secondary | ICD-10-CM | POA: Insufficient documentation

## 2024-01-25 DIAGNOSIS — R350 Frequency of micturition: Secondary | ICD-10-CM | POA: Diagnosis not present

## 2024-01-25 LAB — POCT URINE DIPSTICK
Bilirubin, UA: NEGATIVE
Blood, UA: NEGATIVE
Glucose, UA: NEGATIVE mg/dL
Nitrite, UA: NEGATIVE
POC PROTEIN,UA: NEGATIVE
Spec Grav, UA: <=1.005 — AB (ref 1.010–1.025)
Urobilinogen, UA: 0.2 U/dL
pH, UA: 6.5 (ref 5.0–8.0)

## 2024-01-25 MED ORDER — CIPROFLOXACIN HCL 500 MG PO TABS
500.0000 mg | ORAL_TABLET | Freq: Two times a day (BID) | ORAL | 0 refills | Status: AC
Start: 2024-01-25 — End: ?

## 2024-01-25 NOTE — ED Provider Notes (Signed)
 [facility]  Note:  This document was prepared using Conservation officer, historic buildings and may include unintentional dictation errors.  MRN: [id number] DOB: 1950/11/27  Subjective:   Mercedes Briggs is a 73 y.o. female presenting for 2 week of urinary frequency, urinary urgency, dysuria.  Patient was already seen and treated with cephalexin.  It helped some but did not clear her infection.  Patient has been traveling and has had a difficult time hydrating.  She otherwise has good bladder habits.  No current facility-administered medications for this encounter.  Current Outpatient Medications:    Ascorbic Acid (VITAMIN C) 1000 MG tablet, Take 1,000 mg by mouth daily. With a meal (lunch or supper), Disp: , Rfl:    B Complex-C (B-COMPLEX WITH VITAMIN C) tablet, Take 1 tablet by mouth daily. With a meal (lunch or supper), Disp: , Rfl:    Cholecalciferol (VITAMIN D -3) 125 MCG (5000 UT) TABS, Take 5,000 Units by mouth daily. With a meal (lunch or supper), Disp: , Rfl:    Coenzyme Q-10 100 MG capsule, Take 100 mg by mouth daily. With a meal (lunch or supper), Disp: , Rfl:    levothyroxine  (SYNTHROID ) 100 MCG tablet, TAKE 1 TABLET EVERY MORNINGBEFORE BREAKFAST, Disp: 90 tablet, Rfl: 1   lisinopril  (ZESTRIL ) 5 MG tablet, Take 1 tablet (5 mg total) by mouth daily., Disp: 90 tablet, Rfl: 3   MAGNESIUM GLYCINATE PO, Take by mouth., Disp: , Rfl:    Menaquinone-7 (VITAMIN K2) 100 MCG CAPS, Take 100 mcg by mouth daily. With a meal (lunch or supper), Disp: , Rfl:    Milk Thistle 1000 MG CAPS, Take 1,000 mg by mouth daily. With a meal (lunch or supper), Disp: , Rfl:    Omega-3 Fatty Acids (FISH OIL) 1000 MG CAPS, Take 2,000 mg by mouth daily. With a meal (lunch or supper), Disp: , Rfl:    Allergies  Allergen Reactions   Penicillins Hives    Past Medical History:  Diagnosis Date   Acquired trigger finger of left middle finger 03/07/2022   Acquired trigger finger of  left ring finger 03/07/2022   Age related osteoporosis 11/28/2019   Dexa 11/2019: T = - 2.5 lumbar spine. Offer treatment.    Anxiety    Herniated disc, cervical    Hyperlipidemia    Hypertension    Hypothyroidism    Left wrist pain 03/06/2022   Unilateral primary osteoarthritis of first carpometacarpal joint, left hand 03/07/2022     Past Surgical History:  Procedure Laterality Date   ABDOMINAL HYSTERECTOMY     ANTERIOR CERVICAL DISCECTOMY     CESAREAN SECTION     OPEN REDUCTION INTERNAL FIXATION (ORIF) DISTAL RADIAL FRACTURE Left 08/05/2021   Procedure: LEFT OPEN REDUCTION INTERNAL FIXATION (ORIF) DISTAL RADIAL FRACTURE;  Surgeon: Romona Harari, MD;  Location: [facility] OR;  Service: Orthopedics;  Laterality: Left;   TONSILLECTOMY      Family History  Problem Relation Age of Onset   Alzheimer's disease Mother    Arthritis Mother    Atrial fibrillation Mother    Dementia Mother    Hypertension Mother    Asthma Father    COPD Father    Heart disease Father    Hypertension Father    Diabetes Brother    Neurodegenerative disease Brother        storage cell disease    Social History   Tobacco Use   Smoking status: Never   Smokeless tobacco: Never   Tobacco comments:  occasionally   Vaping Use   Vaping status: Never Used  Substance Use Topics   Alcohol use: Yes    Comment: socially   Drug use: Never    ROS   Objective:   Vitals: BP (!) 152/83 (BP Location: Right Arm)   Pulse 66   Temp 98.2 F (36.8 C) (Oral)   Resp 16   SpO2 98%   Physical Exam Constitutional:      General: She is not in acute distress.    Appearance: Normal appearance. She is well-developed. She is not ill-appearing, toxic-appearing or diaphoretic.  HENT:     Head: Normocephalic and atraumatic.     Nose: Nose normal.     Mouth/Throat:     Mouth: Mucous membranes are moist.  Eyes:     General: No scleral icterus.       Right eye: No discharge.        Left eye: No discharge.      Extraocular Movements: Extraocular movements intact.     Conjunctiva/sclera: Conjunctivae normal.  Cardiovascular:     Rate and Rhythm: Normal rate.  Pulmonary:     Effort: Pulmonary effort is normal.  Abdominal:     General: Bowel sounds are normal. There is no distension.     Palpations: Abdomen is soft. There is no mass.     Tenderness: There is no abdominal tenderness. There is no right CVA tenderness, left CVA tenderness, guarding or rebound.  Skin:    General: Skin is warm and dry.  Neurological:     General: No focal deficit present.     Mental Status: She is alert and oriented to person, place, and time.  Psychiatric:        Mood and Affect: Mood normal.        Behavior: Behavior normal.        Thought Content: Thought content normal.        Judgment: Judgment normal.     Results for orders placed or performed during the hospital encounter of 01/25/24 (from the past 24 hours)  POCT URINE DIPSTICK     Status: Abnormal   Collection Time: 01/25/24  2:11 PM  Result Value Ref Range   Color, UA yellow yellow   Clarity, UA hazy (A) clear   Glucose, UA negative negative mg/dL   Bilirubin, UA negative negative   Ketones, POC UA trace (5) (A) negative mg/dL   Spec Grav, UA <=8.994 (A) 1.010 - 1.025   Blood, UA negative negative   pH, UA 6.5 5.0 - 8.0   POC PROTEIN,UA negative negative, trace   Urobilinogen, UA 0.2 0.2 or 1.0 E.U./dL   Nitrite, UA Negative Negative   Leukocytes, UA Small (1+) (A) Negative    Assessment and Plan :   PDMP not reviewed this encounter.  1. Acute cystitis without hematuria   2. Urinary frequency    CrCl was 69 mL/min based off of results from 03/11/2024.  Start ciprofloxacin (since she just finished cephalexin without good resolution) to cover for acute cystitis, urine culture pending.  Recommended consistent hydration, limiting urinary irritants. Counseled patient on potential for adverse effects with medications prescribed/recommended today,  ER and return-to-clinic precautions discussed, patient verbalized understanding.    Christopher Savannah, [place] 01/25/24 [id number]

## 2024-01-25 NOTE — Discharge Instructions (Addendum)
 Please start ciprofloxacin to address an urinary tract infection. Make sure you hydrate very well with plain water and a quantity of 64 ounces of water a day.  Please limit drinks that are considered urinary irritants such as fruit juices, soda, sweet tea, coffee, artifical sweetened drinks, energy drinks, alcohol.  These can worsen your urinary and genital symptoms but also be the source of them.  I will let you know about your urine culture results through MyChart to see if we need to prescribe or change your antibiotics based off of those results.

## 2024-01-25 NOTE — ED Triage Notes (Signed)
 Pt reports increase urinary frequency, discomfort x 2 weeks. Home testing indicated a positive result for UTI. Reports she finished cephalexin 1 week ago.

## 2024-01-28 ENCOUNTER — Ambulatory Visit (HOSPITAL_COMMUNITY): Payer: Self-pay

## 2024-01-28 LAB — URINE CULTURE: Culture: >=100000 — AB

## 2024-01-29 ENCOUNTER — Encounter: Payer: Self-pay | Admitting: Family Medicine

## 2024-02-01 ENCOUNTER — Other Ambulatory Visit: Payer: Self-pay

## 2024-02-01 DIAGNOSIS — N8110 Cystocele, unspecified: Secondary | ICD-10-CM

## 2024-05-03 ENCOUNTER — Ambulatory Visit: Admitting: Obstetrics

## 2024-07-12 ENCOUNTER — Ambulatory Visit: Admitting: Obstetrics

## 2024-10-03 ENCOUNTER — Ambulatory Visit
